# Patient Record
Sex: Female | Born: 1935 | Race: White | Hispanic: No | Marital: Married | Smoking: Never smoker
Health system: Southern US, Community
[De-identification: ages and names within clinical notes are randomized; demographics above are authoritative.]

## PROBLEM LIST (undated history)

## (undated) DIAGNOSIS — K219 Gastro-esophageal reflux disease without esophagitis: Secondary | ICD-10-CM

## (undated) DIAGNOSIS — I10 Essential (primary) hypertension: Secondary | ICD-10-CM

## (undated) DIAGNOSIS — F419 Anxiety disorder, unspecified: Secondary | ICD-10-CM

## (undated) DIAGNOSIS — N390 Urinary tract infection, site not specified: Secondary | ICD-10-CM

## (undated) DIAGNOSIS — E7800 Pure hypercholesterolemia, unspecified: Secondary | ICD-10-CM

## (undated) DIAGNOSIS — E871 Hypo-osmolality and hyponatremia: Secondary | ICD-10-CM

---

## 2014-07-14 ENCOUNTER — Emergency Department (HOSPITAL_BASED_OUTPATIENT_CLINIC_OR_DEPARTMENT_OTHER): Payer: Medicare Other

## 2014-07-14 ENCOUNTER — Emergency Department (HOSPITAL_BASED_OUTPATIENT_CLINIC_OR_DEPARTMENT_OTHER)
Admission: EM | Admit: 2014-07-14 | Discharge: 2014-07-14 | Disposition: A | Discharge status: Other Acute Inpatient Hospital | Payer: Medicare Other | Attending: Emergency Medicine | Admitting: Emergency Medicine

## 2014-07-14 ENCOUNTER — Encounter (HOSPITAL_BASED_OUTPATIENT_CLINIC_OR_DEPARTMENT_OTHER): Payer: Self-pay | Admitting: Emergency Medicine

## 2014-07-14 DIAGNOSIS — K219 Gastro-esophageal reflux disease without esophagitis: Secondary | ICD-10-CM | POA: Diagnosis not present

## 2014-07-14 DIAGNOSIS — Z79899 Other long term (current) drug therapy: Secondary | ICD-10-CM | POA: Diagnosis not present

## 2014-07-14 DIAGNOSIS — K921 Melena: Secondary | ICD-10-CM | POA: Diagnosis not present

## 2014-07-14 DIAGNOSIS — E871 Hypo-osmolality and hyponatremia: Secondary | ICD-10-CM | POA: Insufficient documentation

## 2014-07-14 DIAGNOSIS — K5900 Constipation, unspecified: Secondary | ICD-10-CM | POA: Diagnosis present

## 2014-07-14 DIAGNOSIS — I10 Essential (primary) hypertension: Secondary | ICD-10-CM | POA: Insufficient documentation

## 2014-07-14 DIAGNOSIS — E780 Pure hypercholesterolemia: Secondary | ICD-10-CM | POA: Diagnosis not present

## 2014-07-14 DIAGNOSIS — F419 Anxiety disorder, unspecified: Secondary | ICD-10-CM | POA: Diagnosis not present

## 2014-07-14 DIAGNOSIS — Z7982 Long term (current) use of aspirin: Secondary | ICD-10-CM | POA: Diagnosis not present

## 2014-07-14 DIAGNOSIS — N390 Urinary tract infection, site not specified: Secondary | ICD-10-CM | POA: Diagnosis not present

## 2014-07-14 HISTORY — DX: Anxiety disorder, unspecified: F41.9

## 2014-07-14 HISTORY — DX: Gastro-esophageal reflux disease without esophagitis: K21.9

## 2014-07-14 HISTORY — DX: Pure hypercholesterolemia, unspecified: E78.00

## 2014-07-14 HISTORY — DX: Essential (primary) hypertension: I10

## 2014-07-14 LAB — COMPREHENSIVE METABOLIC PANEL
ALBUMIN: 3.9 g/dL (ref 3.5–5.2)
ALK PHOS: 73 U/L (ref 39–117)
ALT: 17 U/L (ref 0–35)
AST: 23 U/L (ref 0–37)
Anion gap: 11 (ref 5–15)
BILIRUBIN TOTAL: 0.7 mg/dL (ref 0.3–1.2)
BUN: 16 mg/dL (ref 6–23)
CHLORIDE: 87 mmol/L — AB (ref 96–112)
CO2: 22 mmol/L (ref 19–32)
CREATININE: 0.84 mg/dL (ref 0.50–1.10)
Calcium: 8.8 mg/dL (ref 8.4–10.5)
GFR, EST AFRICAN AMERICAN: 71 mL/min — AB (ref >90)
GFR, EST NON AFRICAN AMERICAN: 62 mL/min — AB (ref >90)
GLUCOSE: 108 mg/dL — AB (ref 70–99)
POTASSIUM: 4.2 mmol/L (ref 3.5–5.1)
Sodium: 120 mmol/L — ABNORMAL LOW (ref 135–145)
Total Protein: 6.5 g/dL (ref 6.0–8.3)

## 2014-07-14 LAB — CBC WITH DIFFERENTIAL/PLATELET
BASOS ABS: 0 10*3/uL (ref 0.0–0.1)
BASOS PCT: 0 % (ref 0–1)
EOS ABS: 0 10*3/uL (ref 0.0–0.7)
Eosinophils Relative: 1 % (ref 0–5)
HCT: 33.9 % — ABNORMAL LOW (ref 36.0–46.0)
Hemoglobin: 12.3 g/dL (ref 12.0–15.0)
LYMPHS PCT: 25 % (ref 12–46)
Lymphs Abs: 2.1 10*3/uL (ref 0.7–4.0)
MCH: 30.3 pg (ref 26.0–34.0)
MCHC: 36.3 g/dL — ABNORMAL HIGH (ref 30.0–36.0)
MCV: 83.5 fL (ref 78.0–100.0)
MONO ABS: 1.1 10*3/uL — AB (ref 0.1–1.0)
Monocytes Relative: 13 % — ABNORMAL HIGH (ref 3–12)
Neutro Abs: 5.2 10*3/uL (ref 1.7–7.7)
Neutrophils Relative %: 61 % (ref 43–77)
PLATELETS: 273 10*3/uL (ref 150–400)
RBC: 4.06 MIL/uL (ref 3.87–5.11)
RDW: 11.2 % — AB (ref 11.5–15.5)
WBC: 8.4 10*3/uL (ref 4.0–10.5)

## 2014-07-14 LAB — URINALYSIS, ROUTINE W REFLEX MICROSCOPIC
Bilirubin Urine: NEGATIVE
GLUCOSE, UA: NEGATIVE mg/dL
Hgb urine dipstick: NEGATIVE
KETONES UR: 15 mg/dL — AB
Nitrite: NEGATIVE
PROTEIN: NEGATIVE mg/dL
Specific Gravity, Urine: 1.016 (ref 1.005–1.030)
Urobilinogen, UA: 0.2 mg/dL (ref 0.0–1.0)
pH: 5.5 (ref 5.0–8.0)

## 2014-07-14 LAB — URINE MICROSCOPIC-ADD ON

## 2014-07-14 MED ORDER — DEXTROSE 5 % IV SOLN
1.0000 g | Freq: Once | INTRAVENOUS | Status: AC
  Administered 2014-07-14: 1 g via INTRAVENOUS

## 2014-07-14 MED ORDER — SODIUM CHLORIDE 0.9 % IV BOLUS (SEPSIS)
1000.0000 mL | Freq: Once | INTRAVENOUS | Status: AC
  Administered 2014-07-14: 1000 mL via INTRAVENOUS

## 2014-07-14 MED ORDER — CEFTRIAXONE SODIUM 1 G IJ SOLR
INTRAMUSCULAR | Status: AC
  Filled 2014-07-14: qty 10

## 2014-07-14 NOTE — ED Provider Notes (Signed)
CSN: [id number]     Arrival date & time 07/14/14  1823 History  This chart was scribed for Rolan Bucco, MD by Freida Busman, ED Scribe. This patient was seen in room MH11/MH11 and the patient's care was started 7:05 PM.    Chief Complaint  Patient presents with  . Constipation  . Anxiety    The history is provided by the patient, the spouse and a relative. No language interpreter was used.   HPI Comments:  Sierra Giles is a 81 y.o. female who presents to the Emergency Department complaining of constipation which first started 3 weeks ago. Pt states she  took miralax which then caused diarrhea. She was then given a second medication to stop the diarrhea but is now constipated again. Her last BM was this am and was small. She states it was her first BM in several days. She denies pain of any kind, nausea, fever, CP, SOB, vomiting, dysuria,  change in normal urination, and cough.  Pt states she also doesn't feel right; states she feels nervous. Pt also notes she feels weaker than usual. No alleviating factors noted. Pt saw PCP ~ 1 week ago after noticing blood in her stool and had a rectal exam but no blood was seen. She did not have blood work done at the time and notes she has a follow up appointment in one week.  Past Medical History  Diagnosis Date  . Hypertension   . GERD (gastroesophageal reflux disease)   . Hypercholesterolemia   . Anxiety    No past surgical history on file. No family history on file. History  Substance Use Topics  . Smoking status: Never Smoker   . Smokeless tobacco: Not on file  . Alcohol Use: Not on file   OB History    No data available     Review of Systems  Constitutional: Positive for fatigue. Negative for fever.  Respiratory: Negative for cough and shortness of breath.   Cardiovascular: Negative for chest pain.  Gastrointestinal: Positive for constipation and blood in stool. Negative for nausea and vomiting.  Genitourinary: Negative for  dysuria and difficulty urinating.  Psychiatric/Behavioral: The patient is nervous/anxious.   All other systems reviewed and are negative.     Allergies  Ambien; Amlodipine; Hydrochlorothiazide; and Nitrofuran derivatives  Home Medications   Prior to Admission medications   Medication Sig Start Date End Date Taking? Authorizing Provider  ALPRAZolam Prudy Feeler) 0.25 MG tablet Take 0.25 mg by mouth at bedtime as needed for anxiety.   Yes Historical Provider, MD  aspirin 81 MG tablet Take 81 mg by mouth every other day.   Yes Historical Provider, MD  cloNIDine (CATAPRES) 0.3 MG tablet Take 0.3 mg by mouth 3 (three) times daily.   Yes Historical Provider, MD  hydrALAZINE (APRESOLINE) 50 MG tablet Take 75 mg by mouth 3 (three) times daily.   Yes Historical Provider, MD  latanoprost (XALATAN) 0.005 % ophthalmic solution Place 1 drop into both eyes at bedtime.   Yes Historical Provider, MD  nebivolol (BYSTOLIC) 2.5 MG tablet Take 2.5 mg by mouth 2 (two) times daily.   Yes Historical Provider, MD  omeprazole (PRILOSEC) 20 MG capsule Take 20 mg by mouth daily.   Yes Historical Provider, MD  simvastatin (ZOCOR) 5 MG tablet Take 5 mg by mouth daily.   Yes Historical Provider, MD  spironolactone (ALDACTONE) 25 MG tablet Take 25 mg by mouth daily.   Yes Historical Provider, MD  valsartan (DIOVAN) 320 MG tablet Take  320 mg by mouth daily.   Yes Historical Provider, MD   BP 163/62 mmHg  Pulse 69  Temp(Src) 98.6 F (37 C) (Oral)  Resp 15  Ht 5\' 3"  (1.6 m)  Wt 132 lb (59.875 kg)  BMI 23.39 kg/m2  SpO2 94% Physical Exam  Constitutional: She is oriented to person, place, and time. She appears well-developed and well-nourished.  HENT:  Head: Normocephalic and atraumatic.  Eyes: Pupils are equal, round, and reactive to light.  Neck: Normal range of motion. Neck supple.  Cardiovascular: Normal rate, regular rhythm and normal heart sounds.   Pulmonary/Chest: Effort normal and breath sounds normal. No  respiratory distress. She has no wheezes. She has no rales. She exhibits no tenderness.  Abdominal: Soft. Bowel sounds are normal. There is no tenderness. There is no rebound and no guarding.  Musculoskeletal: Normal range of motion. She exhibits no edema.  Lymphadenopathy:    She has no cervical adenopathy.  Neurological: She is alert and oriented to person, place, and time.  Skin: Skin is warm and dry. No rash noted.  Psychiatric: She has a normal mood and affect.    ED Course  Procedures   DIAGNOSTIC STUDIES:  Oxygen Saturation is 96% on RA, normal by my interpretation.    COORDINATION OF CARE:  7:13 PM Will order lab work, UA and an XR. Discussed treatment plan with pt and family at bedside and pt agreed to plan.  Labs Review Labs Reviewed  COMPREHENSIVE METABOLIC PANEL - Abnormal; Notable for the following:    Sodium 120 (*)    Chloride 87 (*)    Glucose, Bld 108 (*)    GFR calc non Af Amer 62 (*)    GFR calc Af Amer 71 (*)    All other components within normal limits  CBC WITH DIFFERENTIAL/PLATELET - Abnormal; Notable for the following:    HCT 33.9 (*)    MCHC 36.3 (*)    RDW 11.2 (*)    Monocytes Relative 13 (*)    Monocytes Absolute 1.1 (*)    All other components within normal limits  URINALYSIS, ROUTINE W REFLEX MICROSCOPIC - Abnormal; Notable for the following:    APPearance CLOUDY (*)    Ketones, ur 15 (*)    Leukocytes, UA LARGE (*)    All other components within normal limits  URINE MICROSCOPIC-ADD ON - Abnormal; Notable for the following:    Squamous Epithelial / LPF FEW (*)    Bacteria, UA MANY (*)    All other components within normal limits  URINE CULTURE    Imaging Review Dg Abd Acute W/chest  07/14/2014   CLINICAL DATA:  Weakness,Constipation, and Diarrhea x 2 weeks.  EXAM: ACUTE ABDOMEN SERIES (ABDOMEN 2 VIEW & CHEST 1 VIEW)  COMPARISON:  None.  FINDINGS: There is no evidence of dilated bowel loops or free intraperitoneal air. No radiopaque  calculi or other significant radiographic abnormality is seen. Heart size and mediastinal contours are within normal limits. Both lungs are clear. Hyperexpansion of the chest is present compatible with emphysema. Bilateral pleural apical scarring.  IMPRESSION: Negative abdominal radiographs.  No acute cardiopulmonary disease.   Electronically Signed   By: Andreas NewportGeoffrey  Lamke M.D.   On: 07/14/2014 19:35     EKG Interpretation   Date/Time:  Wednesday July 14 2014 21:19:29 EDT Ventricular Rate:  70 PR Interval:  292 QRS Duration: 78 QT Interval:  384 QTC Calculation: 414 R Axis:   58 Text Interpretation:  Sinus rhythm with 1st  degree A-V block Otherwise  normal ECG No old tracing to compare Confirmed by Horacio Werth  MD, Minas Bonser  [phone]) on 07/14/2014 11:55:15 PM      MDM   Final diagnoses:  Hyponatremia  UTI (lower urinary tract infection)    Patient has evidence of marked hyponatremia. She also has a UTI. She was given IV fluids. She is also given dose of Rocephin. Her urine was sent for culture. I feel that she should be admitted for IV hydration and monitoring until her hyponatremia starts improving. I consult the hospitalist, Dr. Lowell Guitar at [facility] who is accepted the patient for transfer. I also updated the patient's son, Dr. Elder Love, from [place], [place].  I personally performed the services described in this documentation, which was scribed in my presence.  The recorded information has been reviewed and considered.    Rolan Bucco, MD 07/14/14 [phone]

## 2014-07-14 NOTE — ED Notes (Signed)
Pt having constipation for several weeks.  Pt seen by PCP and was given miralax. And then was given something for diarrhea.  Pt also admits she feels "nervous" or anxiety.

## 2014-07-16 LAB — URINE CULTURE: Colony Count: >=100000

## 2014-07-18 ENCOUNTER — Telehealth: Payer: Self-pay | Admitting: Emergency Medicine

## 2014-07-18 NOTE — Telephone Encounter (Signed)
[facility], [facility] [phone].

## 2014-11-28 ENCOUNTER — Emergency Department (HOSPITAL_BASED_OUTPATIENT_CLINIC_OR_DEPARTMENT_OTHER)
Admission: EM | Admit: 2014-11-28 | Discharge: 2014-11-28 | Disposition: A | Discharge status: Home / Self Care | Payer: Medicare Other | Attending: Emergency Medicine | Admitting: Emergency Medicine

## 2014-11-28 ENCOUNTER — Encounter (HOSPITAL_BASED_OUTPATIENT_CLINIC_OR_DEPARTMENT_OTHER): Payer: Self-pay

## 2014-11-28 ENCOUNTER — Emergency Department (HOSPITAL_BASED_OUTPATIENT_CLINIC_OR_DEPARTMENT_OTHER): Payer: Medicare Other

## 2014-11-28 DIAGNOSIS — F419 Anxiety disorder, unspecified: Secondary | ICD-10-CM | POA: Diagnosis not present

## 2014-11-28 DIAGNOSIS — S79911A Unspecified injury of right hip, initial encounter: Secondary | ICD-10-CM | POA: Insufficient documentation

## 2014-11-28 DIAGNOSIS — Y9289 Other specified places as the place of occurrence of the external cause: Secondary | ICD-10-CM | POA: Insufficient documentation

## 2014-11-28 DIAGNOSIS — S0003XA Contusion of scalp, initial encounter: Secondary | ICD-10-CM | POA: Diagnosis not present

## 2014-11-28 DIAGNOSIS — S299XXA Unspecified injury of thorax, initial encounter: Secondary | ICD-10-CM | POA: Diagnosis present

## 2014-11-28 DIAGNOSIS — I10 Essential (primary) hypertension: Secondary | ICD-10-CM | POA: Insufficient documentation

## 2014-11-28 DIAGNOSIS — Z79899 Other long term (current) drug therapy: Secondary | ICD-10-CM | POA: Insufficient documentation

## 2014-11-28 DIAGNOSIS — E780 Pure hypercholesterolemia: Secondary | ICD-10-CM | POA: Diagnosis not present

## 2014-11-28 DIAGNOSIS — Z7982 Long term (current) use of aspirin: Secondary | ICD-10-CM | POA: Insufficient documentation

## 2014-11-28 DIAGNOSIS — Z7951 Long term (current) use of inhaled steroids: Secondary | ICD-10-CM | POA: Diagnosis not present

## 2014-11-28 DIAGNOSIS — Z792 Long term (current) use of antibiotics: Secondary | ICD-10-CM | POA: Diagnosis not present

## 2014-11-28 DIAGNOSIS — S20211A Contusion of right front wall of thorax, initial encounter: Secondary | ICD-10-CM | POA: Diagnosis not present

## 2014-11-28 DIAGNOSIS — Y998 Other external cause status: Secondary | ICD-10-CM | POA: Insufficient documentation

## 2014-11-28 DIAGNOSIS — K219 Gastro-esophageal reflux disease without esophagitis: Secondary | ICD-10-CM | POA: Insufficient documentation

## 2014-11-28 DIAGNOSIS — W19XXXA Unspecified fall, initial encounter: Secondary | ICD-10-CM

## 2014-11-28 DIAGNOSIS — Z87440 Personal history of urinary (tract) infections: Secondary | ICD-10-CM | POA: Diagnosis not present

## 2014-11-28 DIAGNOSIS — Y9389 Activity, other specified: Secondary | ICD-10-CM | POA: Insufficient documentation

## 2014-11-28 DIAGNOSIS — S300XXA Contusion of lower back and pelvis, initial encounter: Secondary | ICD-10-CM | POA: Insufficient documentation

## 2014-11-28 DIAGNOSIS — W07XXXA Fall from chair, initial encounter: Secondary | ICD-10-CM | POA: Diagnosis not present

## 2014-11-28 HISTORY — DX: Urinary tract infection, site not specified: N39.0

## 2014-11-28 HISTORY — DX: Hypo-osmolality and hyponatremia: E87.1

## 2014-11-28 NOTE — Discharge Instructions (Signed)

## 2014-11-28 NOTE — ED Provider Notes (Signed)
CSN: [id number]     Arrival date & time 11/28/14  1631 History  This chart was scribed for Benjiman Core, MD by Budd Palmer, ED Scribe. This patient was seen in room MH11/MH11 and the patient's care was started at 5:11 PM.    Chief Complaint  Patient presents with  . Fall   The history is provided by the patient and a relative. No language interpreter was used.   HPI Comments: Sierra Giles is a 81 y.o. female with a PMHx of HTN and anxiety who presents to the Emergency Department complaining of a fall which occurred 1 day ago. She was sitting in her rocking chair when she got up and her right leg felt numb and gave out on her, causing her to fall. She reports hitting her head. She has since been able to ambulate and states that the leg feels normal now. She notes associated pain in the back of the head, right-sided chest pain exacerbated by movement, as well as ecchymosis and pain to the right hip. She takes baby aspirin every other day. She is not on any blood thinners. She has been treated for a recent UTI and hyponatremia, for which she was admitted to the hospital for 8 days in March of 2016. Per her relatives, she is scheduled for an MRI in a few days to check her pancreas. She has been on antibiotics for the past 7 days. She has had blood work done by a GI specialist and her PCP last week.  Past Medical History  Diagnosis Date  . Hypertension   . GERD (gastroesophageal reflux disease)   . Hypercholesterolemia   . Anxiety   . Hyponatremia   . UTI (lower urinary tract infection)    No past surgical history on file. No family history on file. Social History  Substance Use Topics  . Smoking status: Never Smoker   . Smokeless tobacco: None  . Alcohol Use: None   OB History    No data available     Review of Systems  Cardiovascular: Positive for chest pain.  Musculoskeletal: Positive for myalgias.  Skin: Positive for color change.  All other systems reviewed and are  negative.   Allergies  Ambien; Amlodipine; Hydrochlorothiazide; and Nitrofuran derivatives  Home Medications   Prior to Admission medications   Medication Sig Start Date End Date Taking? Authorizing Provider  ciprofloxacin (CIPRO) 250 MG tablet Take 250 mg by mouth 2 (two) times daily.   Yes Historical Provider, MD  escitalopram (LEXAPRO) 5 MG tablet Take 5 mg by mouth daily.   Yes Historical Provider, MD  fluticasone (FLONASE) 50 MCG/ACT nasal spray Place into both nostrils daily.   Yes Historical Provider, MD  Loratadine (CLARITIN PO) Take by mouth.   Yes Historical Provider, MD  traZODone (DESYREL) 50 MG tablet Take 50 mg by mouth at bedtime.   Yes Historical Provider, MD  ALPRAZolam Prudy Feeler) 0.25 MG tablet Take 0.5 mg by mouth 2 (two) times daily as needed for anxiety.     Historical Provider, MD  aspirin 81 MG tablet Take 81 mg by mouth every other day.    Historical Provider, MD  cloNIDine (CATAPRES) 0.3 MG tablet Take 0.3 mg by mouth 3 (three) times daily.    Historical Provider, MD  hydrALAZINE (APRESOLINE) 50 MG tablet Take 50 mg by mouth 3 (three) times daily.     Historical Provider, MD  latanoprost (XALATAN) 0.005 % ophthalmic solution Place 1 drop into both eyes at bedtime.  Historical Provider, MD  nebivolol (BYSTOLIC) 2.5 MG tablet Take 2.5 mg by mouth 2 (two) times daily.    Historical Provider, MD  omeprazole (PRILOSEC) 20 MG capsule Take 20 mg by mouth daily.    Historical Provider, MD  simvastatin (ZOCOR) 5 MG tablet Take 5 mg by mouth daily.    Historical Provider, MD  spironolactone (ALDACTONE) 25 MG tablet Take 25 mg by mouth daily.    Historical Provider, MD  valsartan (DIOVAN) 320 MG tablet Take 320 mg by mouth daily.    Historical Provider, MD   BP 161/59 mmHg  Pulse 75  Temp(Src) 98.1 F (36.7 C) (Oral)  Resp 18  Ht 5' 3.5" (1.613 m)  Wt 118 lb (53.524 kg)  BMI 20.57 kg/m2  SpO2 94% Physical Exam  Constitutional: She is oriented to person, place, and  time. She appears well-developed and well-nourished. No distress.  HENT:  Head: Normocephalic and atraumatic.  Mouth/Throat: Oropharynx is clear and moist.  Small hematoma on the right occipital area of the scalp with no ttp and no step-off.  Face is atraumatic  Eyes: Conjunctivae and EOM are normal. Pupils are equal, round, and reactive to light.  Neck: Normal range of motion. Neck supple. No tracheal deviation present.  Cardiovascular: Normal rate, regular rhythm and normal heart sounds.   Pulmonary/Chest: Effort normal and breath sounds normal. No respiratory distress. She exhibits tenderness.  Moderate ttp on right chest  Abdominal: Soft. Bowel sounds are normal. She exhibits no mass. There is no tenderness.  Musculoskeletal: Normal range of motion.  No c-spine ttp. No CVA tenderness. Good ROM of R hip with no ttp. Pelvis is stable to compression.  Neurological: She is alert and oriented to person, place, and time.  Skin: Skin is warm and dry.  Ecchymosis in R buttock area.  Psychiatric: She has a normal mood and affect. Her behavior is normal.  Nursing note and vitals reviewed.   ED Course  Procedures  DIAGNOSTIC STUDIES: Oxygen Saturation is 100% on RA, normal by my interpretation.    COORDINATION OF CARE: 5:19 PM - Discussed plans to order diagnostic imaging. Pt advised of plan for treatment and pt agrees.  Labs Review Labs Reviewed - No data to display  Imaging Review No results found.   EKG Interpretation   Date/Time:  Sunday November 28 2014 17:00:32 EDT Ventricular Rate:  86 PR Interval:  298 QRS Duration: 68 QT Interval:  370 QTC Calculation: 442 R Axis:   36 Text Interpretation:  Sinus rhythm with 1st degree A-V block Otherwise  normal ECG ED PHYSICIAN INTERPRETATION AVAILABLE IN [facility]  Confirmed by TEST, Record ([id number]) on 11/29/2014 6:40:18 AM      MDM   Final diagnoses:  Fall, initial encounter  Chest wall contusion, right, initial encounter   Contusion, buttock, initial encounter    Patient with fall. Negative imaging. Discussed possibility of occult rib fracture.  Will discharge home.   I personally performed the services described in this documentation, which was scribed in my presence. The recorded information has been reviewed and is accurate.    Benjiman Core, MD 12/01/14 [phone]

## 2014-11-28 NOTE — ED Notes (Addendum)
Patient states that she fell last night because her leg would not support her, c/o R hip pain, hit R side of head, sternum pain & bruise to L side of buttock, hurts to walk & take a breath. Currently on antibiotic for UTI

## 2015-08-16 ENCOUNTER — Emergency Department (HOSPITAL_BASED_OUTPATIENT_CLINIC_OR_DEPARTMENT_OTHER): Payer: Medicare Other

## 2015-08-16 ENCOUNTER — Emergency Department (HOSPITAL_BASED_OUTPATIENT_CLINIC_OR_DEPARTMENT_OTHER)
Admission: EM | Admit: 2015-08-16 | Discharge: 2015-08-16 | Disposition: A | Discharge status: Home / Self Care | Payer: Medicare Other | Attending: Emergency Medicine | Admitting: Emergency Medicine

## 2015-08-16 ENCOUNTER — Encounter (HOSPITAL_BASED_OUTPATIENT_CLINIC_OR_DEPARTMENT_OTHER): Payer: Self-pay | Admitting: *Deleted

## 2015-08-16 DIAGNOSIS — Z87440 Personal history of urinary (tract) infections: Secondary | ICD-10-CM | POA: Insufficient documentation

## 2015-08-16 DIAGNOSIS — Z8639 Personal history of other endocrine, nutritional and metabolic disease: Secondary | ICD-10-CM | POA: Insufficient documentation

## 2015-08-16 DIAGNOSIS — Z792 Long term (current) use of antibiotics: Secondary | ICD-10-CM | POA: Insufficient documentation

## 2015-08-16 DIAGNOSIS — F419 Anxiety disorder, unspecified: Secondary | ICD-10-CM | POA: Diagnosis not present

## 2015-08-16 DIAGNOSIS — J029 Acute pharyngitis, unspecified: Secondary | ICD-10-CM | POA: Insufficient documentation

## 2015-08-16 DIAGNOSIS — M1990 Unspecified osteoarthritis, unspecified site: Secondary | ICD-10-CM | POA: Insufficient documentation

## 2015-08-16 DIAGNOSIS — Z7982 Long term (current) use of aspirin: Secondary | ICD-10-CM | POA: Insufficient documentation

## 2015-08-16 DIAGNOSIS — K219 Gastro-esophageal reflux disease without esophagitis: Secondary | ICD-10-CM | POA: Diagnosis not present

## 2015-08-16 DIAGNOSIS — I10 Essential (primary) hypertension: Secondary | ICD-10-CM | POA: Insufficient documentation

## 2015-08-16 DIAGNOSIS — R079 Chest pain, unspecified: Secondary | ICD-10-CM | POA: Diagnosis present

## 2015-08-16 DIAGNOSIS — Z79899 Other long term (current) drug therapy: Secondary | ICD-10-CM | POA: Diagnosis not present

## 2015-08-16 DIAGNOSIS — R091 Pleurisy: Secondary | ICD-10-CM | POA: Diagnosis not present

## 2015-08-16 LAB — CBC WITH DIFFERENTIAL/PLATELET
BASOS ABS: 0 10*3/uL (ref 0.0–0.1)
BASOS PCT: 0 %
EOS PCT: 2 %
Eosinophils Absolute: 0.2 10*3/uL (ref 0.0–0.7)
HCT: 33.4 % — ABNORMAL LOW (ref 36.0–46.0)
Hemoglobin: 11.5 g/dL — ABNORMAL LOW (ref 12.0–15.0)
Lymphocytes Relative: 11 %
Lymphs Abs: 1 10*3/uL (ref 0.7–4.0)
MCH: 30.7 pg (ref 26.0–34.0)
MCHC: 34.4 g/dL (ref 30.0–36.0)
MCV: 89.1 fL (ref 78.0–100.0)
MONO ABS: 0.5 10*3/uL (ref 0.1–1.0)
Monocytes Relative: 6 %
NEUTROS ABS: 7.7 10*3/uL (ref 1.7–7.7)
Neutrophils Relative %: 81 %
PLATELETS: 262 10*3/uL (ref 150–400)
RBC: 3.75 MIL/uL — ABNORMAL LOW (ref 3.87–5.11)
RDW: 12.3 % (ref 11.5–15.5)
WBC: 9.4 10*3/uL (ref 4.0–10.5)

## 2015-08-16 LAB — URINALYSIS, ROUTINE W REFLEX MICROSCOPIC
BILIRUBIN URINE: NEGATIVE
Glucose, UA: NEGATIVE mg/dL
Hgb urine dipstick: NEGATIVE
KETONES UR: NEGATIVE mg/dL
NITRITE: NEGATIVE
Protein, ur: NEGATIVE mg/dL
Specific Gravity, Urine: 1.011 (ref 1.005–1.030)
pH: 6.5 (ref 5.0–8.0)

## 2015-08-16 LAB — BASIC METABOLIC PANEL
ANION GAP: 11 (ref 5–15)
BUN: 12 mg/dL (ref 6–20)
CALCIUM: 8.2 mg/dL — AB (ref 8.9–10.3)
CO2: 21 mmol/L — ABNORMAL LOW (ref 22–32)
Chloride: 96 mmol/L — ABNORMAL LOW (ref 101–111)
Creatinine, Ser: 0.65 mg/dL (ref 0.44–1.00)
Glucose, Bld: 99 mg/dL (ref 65–99)
Potassium: 4.3 mmol/L (ref 3.5–5.1)
Sodium: 128 mmol/L — ABNORMAL LOW (ref 135–145)

## 2015-08-16 LAB — URINE MICROSCOPIC-ADD ON

## 2015-08-16 LAB — RAPID STREP SCREEN (MED CTR MEBANE ONLY): STREPTOCOCCUS, GROUP A SCREEN (DIRECT): NEGATIVE

## 2015-08-16 LAB — TROPONIN I: Troponin I: <0.03 ng/mL (ref <0.031)

## 2015-08-16 MED ORDER — NAPROXEN 375 MG PO TABS: 375.0000 mg | ORAL_TABLET | Freq: Two times a day (BID) | ORAL | Status: DC

## 2015-08-16 MED ORDER — HYDROCODONE-ACETAMINOPHEN 7.5-325 MG/15ML PO SOLN: ORAL | Status: DC

## 2015-08-16 MED ORDER — ONDANSETRON HCL 4 MG/2ML IJ SOLN
4.0000 mg | Freq: Once | INTRAMUSCULAR | Status: AC
  Administered 2015-08-16: 4 mg via INTRAVENOUS
  Filled 2015-08-16: qty 2

## 2015-08-16 MED ORDER — HYDROCODONE-ACETAMINOPHEN 5-325 MG PO TABS
1.0000 | ORAL_TABLET | Freq: Once | ORAL | Status: AC
  Administered 2015-08-16: 1 via ORAL
  Filled 2015-08-16: qty 1

## 2015-08-16 NOTE — ED Notes (Signed)
C/o sorethroat and pain under left breast, bil hand pain  And left arm pain. No n/v, slight shortness  Of breath. Onset Thursday.

## 2015-08-16 NOTE — Discharge Instructions (Signed)
Please recheck with rash or blisters on the chest, fevers, worsening pain, or other symptoms. One half, to 1 teaspoon of Rx pain medicine every 4-6 hours as needed for pain.   Pleurisy Pleurisy is an inflammation and swelling of the lining of the lungs (pleura). Because of this inflammation, it hurts to breathe. It can be aggravated by coughing, laughing, or deep breathing. Pleurisy is often caused by an underlying infection or disease.  HOME CARE INSTRUCTIONS  Monitor your pleurisy for any changes. The following actions may help to alleviate any discomfort you are experiencing:  Medicine may help with pain. Only take over-the-counter or prescription medicines for pain, discomfort, or fever as directed by your health care provider.  Only take antibiotic medicine as directed. Make sure to finish it even if you start to feel better. SEEK MEDICAL CARE IF:   Your pain is not controlled with medicine or is increasing.  You have an increase in pus-like (purulent) secretions brought up with coughing. SEEK IMMEDIATE MEDICAL CARE IF:   You have blue or dark lips, fingernails, or toenails.  You are coughing up blood.  You have increased difficulty breathing.  You have continuing pain unrelieved by medicine or pain lasting more than 1 week.  You have pain that radiates into your neck, arms, or jaw.  You develop increased shortness of breath or wheezing.  You develop a fever, rash, vomiting, fainting, or other serious symptoms. MAKE SURE YOU:  Understand these instructions.   Will watch your condition.   Will get help right away if you are not doing well or get worse.    This information is not intended to replace advice given to you by your health care provider. Make sure you discuss any questions you have with your health care provider.   Document Released: 04/09/2005 Document Revised: 12/10/2012 Document Reviewed: 09/21/2012 Elsevier Interactive Patient Education [employer]2016 Elsevier  Inc.

## 2015-08-16 NOTE — ED Provider Notes (Addendum)
CSN: [id number]     Arrival date & time 08/16/15  1537 History   First MD Initiated Contact with Patient 08/16/15 1545     Chief Complaint  Patient presents with  . Chest Pain     HPI  She presents for evaluation of left-sided chest pain. Symptoms for the last 4 days. Her main complaint to me upon arrival to sore throat for 4 days and then mentions the left breast. Triage note states some difficulty using her hands. 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 No nausea vomiting. No fevers. Shortness of breath only related to pain with deep breathing. No shift cardiac disease. She is a nonsmoker.  Past Medical History  Diagnosis Date  . Hypertension   . GERD (gastroesophageal reflux disease)   . Hypercholesterolemia   . Anxiety   . Hyponatremia   . UTI (lower urinary tract infection)    History reviewed. No pertinent past surgical history. No family history on file. Social History  Substance Use Topics  . Smoking status: Never Smoker   . Smokeless tobacco: None  . Alcohol Use: None   OB History    No data available     Review of Systems  Constitutional: Negative for fever, chills, diaphoresis, appetite change and fatigue.  HENT: Positive for sore throat. Negative for mouth sores and trouble swallowing.   Eyes: Negative for visual disturbance.  Respiratory: Negative for cough, chest tightness, shortness of breath and wheezing.   Cardiovascular: Positive for chest pain.  Gastrointestinal: Negative for nausea, vomiting, abdominal pain, diarrhea and abdominal distention.  Endocrine: Negative for polydipsia, polyphagia and polyuria.  Genitourinary: Negative for dysuria, frequency and hematuria.  Musculoskeletal: Negative for gait problem.  Skin: Negative for color change, pallor and rash.  Neurological: Negative for dizziness, syncope, light-headedness and headaches.  Hematological: Does not bruise/bleed  easily.  Psychiatric/Behavioral: Negative for behavioral problems and confusion.      Allergies  Ambien; Amlodipine; Hydrochlorothiazide; and Nitrofuran derivatives  Home Medications   Prior to Admission medications   Medication Sig Start Date End Date Taking? Authorizing Provider  ALPRAZolam (XANAX) 0.25 MG tablet Take 0.5 mg by mouth 2 (two) times daily as needed for anxiety.    Yes Historical Provider, MD  aspirin 81 MG tablet Take 81 mg by mouth every other day.   Yes Historical Provider, MD  ciprofloxacin (CIPRO) 250 MG tablet Take 250 mg by mouth 2 (two) times daily.   Yes Historical Provider, MD  cloNIDine (CATAPRES) 0.3 MG tablet Take 0.3 mg by mouth 3 (three) times daily.   Yes Historical Provider, MD  escitalopram (LEXAPRO) 5 MG tablet Take 5 mg by mouth daily.   Yes Historical Provider, MD  hydrALAZINE (APRESOLINE) 50 MG tablet Take 50 mg by mouth 3 (three) times daily.    Yes Historical Provider, MD  nebivolol (BYSTOLIC) 2.5 MG tablet Take 2.5 mg by mouth 2 (two) times daily.   Yes Historical Provider, MD  omeprazole (PRILOSEC) 20 MG capsule Take 20 mg by mouth daily.   Yes Historical Provider, MD  traZODone (DESYREL) 50 MG tablet Take 50 mg by mouth at bedtime.   Yes Historical Provider, MD  HYDROcodone-acetaminophen (HYCET) 7.5-325 mg/15 ml solution 2.5-5 ml po q 4 hrs prn pain 08/16/15   Rolland Porter, MD  naproxen (NAPROSYN) 375 MG tablet Take 1 tablet (375 mg total) by mouth 2 (two) times daily. 08/16/15   Rolland Porter,  MD   BP 145/58 mmHg  Pulse 80  Temp(Src) 98.1 F (36.7 C) (Oral)  Resp 16  Ht  (1.626 m)  Wt 118 lb (53.524 kg)  BMI 20.24 kg/m2  SpO2 96% Physical Exam  Constitutional: She is oriented to person, place, and time. She appears well-developed and well-nourished. No distress.  HENT:  Head: Normocephalic.  Posterior differential erythema but no exudate. No adenopathy in the neck. Supple neck.  Eyes: Conjunctivae are normal. Pupils are equal, round,  and reactive to light. No scleral icterus.  Neck: Normal range of motion. Neck supple. No thyromegaly present.  Cardiovascular: Normal rate and regular rhythm.  Exam reveals no gallop and no friction rub.   No murmur heard. Pulmonary/Chest: Effort normal and breath sounds normal. No respiratory distress. She has no wheezes. She has no rales.  Lungs clear. No wheezing rales rhonchi. Nontender over the chest wall skin. No vesicles or signs of zoster clinically.  Abdominal: Soft. Bowel sounds are normal. She exhibits no distension. There is no tenderness. There is no rebound.  Musculoskeletal: Normal range of motion.  Neurological: She is alert and oriented to person, place, and time.  Skin: Skin is warm and dry. No rash noted.  Psychiatric: She has a normal mood and affect. Her behavior is normal.    ED Course  Procedures (including critical care time) Labs Review Labs Reviewed  CBC WITH DIFFERENTIAL/PLATELET - Abnormal; Notable for the following:    RBC 3.75 (*)    Hemoglobin 11.5 (*)    HCT 33.4 (*)    All other components within normal limits  BASIC METABOLIC PANEL - Abnormal; Notable for the following:    Sodium 128 (*)    Chloride 96 (*)    CO2 21 (*)    Calcium 8.2 (*)    All other components within normal limits  URINALYSIS, ROUTINE W REFLEX MICROSCOPIC (NOT AT [facility]) - Abnormal; Notable for the following:    Leukocytes, UA SMALL (*)    All other components within normal limits  URINE MICROSCOPIC-ADD ON - Abnormal; Notable for the following:    Squamous Epithelial / LPF 0-5 (*)    Bacteria, UA RARE (*)    All other components within normal limits  RAPID STREP SCREEN (NOT AT [facility])  CULTURE, GROUP A STREP [facility])  TROPONIN I  TROPONIN I    Imaging Review Dg Chest 2 View  08/16/2015  CLINICAL DATA:  Left-sided chest pain for few days with shortness of Breath EXAM: CHEST  2 VIEW COMPARISON:  11/28/2014 FINDINGS: Cardiac shadow is stable. The lungs are well aerated  bilaterally. No focal infiltrate or sizable effusion is seen. No acute bony abnormality is noted. IMPRESSION: No active cardiopulmonary disease. Electronically Signed   By: Alcide Clever M.D.   On: 08/16/2015 16:55   I have personally reviewed and evaluated these images and lab results as part of my medical decision-making.   EKG Interpretation   Date/Time:  Tuesday August 16 2015 16:05:28 EDT Ventricular Rate:  81 PR Interval:    QRS Duration: 87 QT Interval:  385 QTC Calculation: 447 R Axis:   46 Text Interpretation:  Sinus rhythm  with artifact Probable anterior  infarct, old Artifact in lead(s) I III aVR aVL Reconfirmed by Fayrene Fearing  MD,  Joangel Vanosdol ([id number]) on 08/16/2015 7:51:16 PM      MDM   Final diagnoses:  Pleurisy   Negative workup here. 2 troponins normal 2. EKG without acute changes. No abnormal is on exam,  her chest x-ray. Negative strep. Likely pleurisy. His had viral symptoms and her husband was ill several days ago. Her son is a cardiologist and spoken with her on Sunday about her symptoms and was not concerned. I did speak with him on the phone and relayed her findings. Plan will be Lortab, naproxen, primary care follow-up. Return precautions including rash, worsening pain, shortness of breath, fever, or other new or worsening.     Rolland PorterMark Kyaira Trantham, MD 08/16/15 1950  Rolland PorterMark Shahir Karen, MD 08/16/15 [phone]

## 2015-08-19 LAB — CULTURE, GROUP A STREP (THRC)

## 2017-05-30 ENCOUNTER — Emergency Department (HOSPITAL_BASED_OUTPATIENT_CLINIC_OR_DEPARTMENT_OTHER): Payer: Medicare Other

## 2017-05-30 ENCOUNTER — Other Ambulatory Visit: Payer: Self-pay

## 2017-05-30 ENCOUNTER — Encounter (HOSPITAL_BASED_OUTPATIENT_CLINIC_OR_DEPARTMENT_OTHER): Payer: Self-pay | Admitting: Emergency Medicine

## 2017-05-30 ENCOUNTER — Inpatient Hospital Stay (HOSPITAL_BASED_OUTPATIENT_CLINIC_OR_DEPARTMENT_OTHER)
Admission: EM | Admit: 2017-05-30 | Discharge: 2017-06-10 | DRG: 871 | Disposition: A | Discharge status: Skilled Nursing Facility | Payer: Medicare Other | Attending: Internal Medicine | Admitting: Internal Medicine

## 2017-05-30 DIAGNOSIS — K869 Disease of pancreas, unspecified: Secondary | ICD-10-CM | POA: Diagnosis present

## 2017-05-30 DIAGNOSIS — Y848 Other medical procedures as the cause of abnormal reaction of the patient, or of later complication, without mention of misadventure at the time of the procedure: Secondary | ICD-10-CM | POA: Diagnosis not present

## 2017-05-30 DIAGNOSIS — K219 Gastro-esophageal reflux disease without esophagitis: Secondary | ICD-10-CM | POA: Diagnosis present

## 2017-05-30 DIAGNOSIS — Z79899 Other long term (current) drug therapy: Secondary | ICD-10-CM

## 2017-05-30 DIAGNOSIS — J189 Pneumonia, unspecified organism: Secondary | ICD-10-CM

## 2017-05-30 DIAGNOSIS — Z888 Allergy status to other drugs, medicaments and biological substances status: Secondary | ICD-10-CM

## 2017-05-30 DIAGNOSIS — R0600 Dyspnea, unspecified: Secondary | ICD-10-CM

## 2017-05-30 DIAGNOSIS — Y731 Therapeutic (nonsurgical) and rehabilitative gastroenterology and urology devices associated with adverse incidents: Secondary | ICD-10-CM | POA: Diagnosis not present

## 2017-05-30 DIAGNOSIS — E43 Unspecified severe protein-calorie malnutrition: Secondary | ICD-10-CM | POA: Diagnosis present

## 2017-05-30 DIAGNOSIS — R944 Abnormal results of kidney function studies: Secondary | ICD-10-CM | POA: Diagnosis present

## 2017-05-30 DIAGNOSIS — D649 Anemia, unspecified: Secondary | ICD-10-CM | POA: Diagnosis present

## 2017-05-30 DIAGNOSIS — E876 Hypokalemia: Secondary | ICD-10-CM | POA: Diagnosis present

## 2017-05-30 DIAGNOSIS — Z681 Body mass index (BMI) 19 or less, adult: Secondary | ICD-10-CM | POA: Diagnosis not present

## 2017-05-30 DIAGNOSIS — I10 Essential (primary) hypertension: Secondary | ICD-10-CM | POA: Diagnosis present

## 2017-05-30 DIAGNOSIS — Z881 Allergy status to other antibiotic agents status: Secondary | ICD-10-CM

## 2017-05-30 DIAGNOSIS — F419 Anxiety disorder, unspecified: Secondary | ICD-10-CM | POA: Diagnosis not present

## 2017-05-30 DIAGNOSIS — Z5329 Procedure and treatment not carried out because of patient's decision for other reasons: Secondary | ICD-10-CM | POA: Diagnosis not present

## 2017-05-30 DIAGNOSIS — T3695XA Adverse effect of unspecified systemic antibiotic, initial encounter: Secondary | ICD-10-CM | POA: Diagnosis not present

## 2017-05-30 DIAGNOSIS — J690 Pneumonitis due to inhalation of food and vomit: Secondary | ICD-10-CM | POA: Diagnosis present

## 2017-05-30 DIAGNOSIS — R197 Diarrhea, unspecified: Secondary | ICD-10-CM | POA: Diagnosis not present

## 2017-05-30 DIAGNOSIS — Z7982 Long term (current) use of aspirin: Secondary | ICD-10-CM

## 2017-05-30 DIAGNOSIS — A419 Sepsis, unspecified organism: Secondary | ICD-10-CM | POA: Diagnosis present

## 2017-05-30 DIAGNOSIS — M549 Dorsalgia, unspecified: Secondary | ICD-10-CM | POA: Diagnosis present

## 2017-05-30 DIAGNOSIS — J9601 Acute respiratory failure with hypoxia: Secondary | ICD-10-CM | POA: Diagnosis present

## 2017-05-30 DIAGNOSIS — B370 Candidal stomatitis: Secondary | ICD-10-CM | POA: Diagnosis present

## 2017-05-30 DIAGNOSIS — F411 Generalized anxiety disorder: Secondary | ICD-10-CM | POA: Diagnosis present

## 2017-05-30 DIAGNOSIS — R402414 Glasgow coma scale score 13-15, 24 hours or more after hospital admission: Secondary | ICD-10-CM | POA: Diagnosis not present

## 2017-05-30 DIAGNOSIS — Y92230 Patient room in hospital as the place of occurrence of the external cause: Secondary | ICD-10-CM | POA: Diagnosis not present

## 2017-05-30 DIAGNOSIS — Z791 Long term (current) use of non-steroidal anti-inflammatories (NSAID): Secondary | ICD-10-CM

## 2017-05-30 DIAGNOSIS — R5381 Other malaise: Secondary | ICD-10-CM | POA: Diagnosis present

## 2017-05-30 DIAGNOSIS — T859XXA Unspecified complication of internal prosthetic device, implant and graft, initial encounter: Secondary | ICD-10-CM | POA: Diagnosis not present

## 2017-05-30 DIAGNOSIS — R1310 Dysphagia, unspecified: Secondary | ICD-10-CM | POA: Diagnosis not present

## 2017-05-30 DIAGNOSIS — J449 Chronic obstructive pulmonary disease, unspecified: Secondary | ICD-10-CM | POA: Diagnosis present

## 2017-05-30 DIAGNOSIS — J019 Acute sinusitis, unspecified: Secondary | ICD-10-CM | POA: Diagnosis not present

## 2017-05-30 LAB — CBC WITH DIFFERENTIAL/PLATELET
Basophils Absolute: 0 10*3/uL (ref 0.0–0.1)
Basophils Relative: 0 %
Eosinophils Absolute: 0 10*3/uL (ref 0.0–0.7)
Eosinophils Relative: 0 %
HEMATOCRIT: 33.6 % — AB (ref 36.0–46.0)
HEMOGLOBIN: 11.2 g/dL — AB (ref 12.0–15.0)
LYMPHS PCT: 4 %
Lymphs Abs: 1.3 10*3/uL (ref 0.7–4.0)
MCH: 28.7 pg (ref 26.0–34.0)
MCHC: 33.3 g/dL (ref 30.0–36.0)
MCV: 86.2 fL (ref 78.0–100.0)
MONOS PCT: 2 %
Monocytes Absolute: 0.6 10*3/uL (ref 0.1–1.0)
NEUTROS ABS: 30.1 10*3/uL — AB (ref 1.7–7.7)
NEUTROS PCT: 94 %
Platelets: 376 10*3/uL (ref 150–400)
RBC: 3.9 MIL/uL (ref 3.87–5.11)
RDW: 14.1 % (ref 11.5–15.5)
WBC: 32 10*3/uL — ABNORMAL HIGH (ref 4.0–10.5)

## 2017-05-30 LAB — COMPREHENSIVE METABOLIC PANEL
ALBUMIN: 2.7 g/dL — AB (ref 3.5–5.0)
ALT: 18 U/L (ref 14–54)
ANION GAP: 13 (ref 5–15)
AST: 26 U/L (ref 15–41)
Alkaline Phosphatase: 114 U/L (ref 38–126)
BILIRUBIN TOTAL: 0.9 mg/dL (ref 0.3–1.2)
BUN: 31 mg/dL — ABNORMAL HIGH (ref 6–20)
CO2: 25 mmol/L (ref 22–32)
Calcium: 8.6 mg/dL — ABNORMAL LOW (ref 8.9–10.3)
Chloride: 100 mmol/L — ABNORMAL LOW (ref 101–111)
Creatinine, Ser: 0.68 mg/dL (ref 0.44–1.00)
GFR calc non Af Amer: >60 mL/min (ref >60)
GLUCOSE: 102 mg/dL — AB (ref 65–99)
POTASSIUM: 3.2 mmol/L — AB (ref 3.5–5.1)
SODIUM: 138 mmol/L (ref 135–145)
Total Protein: 7.5 g/dL (ref 6.5–8.1)

## 2017-05-30 LAB — URINALYSIS, ROUTINE W REFLEX MICROSCOPIC
Glucose, UA: NEGATIVE mg/dL
Hgb urine dipstick: NEGATIVE
Ketones, ur: 15 mg/dL — AB
NITRITE: NEGATIVE
PH: 6 (ref 5.0–8.0)
Protein, ur: 30 mg/dL — AB
SPECIFIC GRAVITY, URINE: 1.015 (ref 1.005–1.030)

## 2017-05-30 LAB — URINALYSIS, MICROSCOPIC (REFLEX)

## 2017-05-30 MED ORDER — AZITHROMYCIN 500 MG IV SOLR
INTRAVENOUS | Status: AC
  Filled 2017-05-30: qty 500

## 2017-05-30 MED ORDER — DEXTROSE 5 % IV SOLN
500.0000 mg | Freq: Once | INTRAVENOUS | Status: AC
  Administered 2017-05-30: 500 mg via INTRAVENOUS
  Filled 2017-05-30: qty 500

## 2017-05-30 MED ORDER — IOPAMIDOL (ISOVUE-300) INJECTION 61%
100.0000 mL | Freq: Once | INTRAVENOUS | Status: AC | PRN
  Administered 2017-05-30: 100 mL via INTRAVENOUS

## 2017-05-30 MED ORDER — SODIUM CHLORIDE 0.9 % IV BOLUS (SEPSIS)
500.0000 mL | Freq: Once | INTRAVENOUS | Status: AC
  Administered 2017-05-30: 500 mL via INTRAVENOUS

## 2017-05-30 MED ORDER — CEFTRIAXONE SODIUM 1 G IJ SOLR
1.0000 g | Freq: Once | INTRAMUSCULAR | Status: AC
  Administered 2017-05-30: 1 g via INTRAVENOUS
  Filled 2017-05-30: qty 10

## 2017-05-30 NOTE — ED Provider Notes (Signed)
[facility] EMERGENCY DEPARTMENT Provider Note   CSN: [id number] Arrival date & time: 05/30/17  1613     History   Chief Complaint Chief Complaint  Patient presents with  . Cough    HPI Dawsyn Zurn is a 84 y.o. female.  HPI Patient presents with cough for the last couple weeks.  Had x-ray done at primary care doctor yesterday that showed multifocal pneumonia.  Started on antibiotics.  However patient has had sore throat and was unable to really take the medicines.  Decreased appetite.  Hypoxic on room air upon arrival.  Has had a cough.  Has had chills.  No fever here.  History of hyponatremia. Past Medical History:  Diagnosis Date  . Anxiety   . GERD (gastroesophageal reflux disease)   . Hypercholesterolemia   . Hypertension   . Hyponatremia   . UTI (lower urinary tract infection)     There are no active problems to display for this patient.   History reviewed. No pertinent surgical history.  OB History    No data available       Home Medications    Prior to Admission medications   Medication Sig Start Date End Date Taking? Authorizing Provider  ALPRAZolam (XANAX) 0.25 MG tablet Take 0.5 mg by mouth 2 (two) times daily as needed for anxiety.     [provider]  aspirin 81 MG tablet Take 81 mg by mouth every other day.    [provider]  ciprofloxacin (CIPRO) 250 MG tablet Take 250 mg by mouth 2 (two) times daily.    [provider]  cloNIDine (CATAPRES) 0.3 MG tablet Take 0.3 mg by mouth 3 (three) times daily.    [provider]  escitalopram (LEXAPRO) 5 MG tablet Take 5 mg by mouth daily.    [provider]  hydrALAZINE (APRESOLINE) 50 MG tablet Take 50 mg by mouth 3 (three) times daily.     [provider]  HYDROcodone-acetaminophen (HYCET) 7.5-325 mg/15 ml solution 2.5-5 ml po q 4 hrs prn pain 08/16/15   Rolland Porter, MD  naproxen (NAPROSYN) 375 MG tablet Take 1 tablet (375 mg total) by mouth 2  (two) times daily. 08/16/15   Rolland Porter, MD  nebivolol (BYSTOLIC) 2.5 MG tablet Take 2.5 mg by mouth 2 (two) times daily.    [provider]  omeprazole (PRILOSEC) 20 MG capsule Take 20 mg by mouth daily.    [provider]  traZODone (DESYREL) 50 MG tablet Take 50 mg by mouth at bedtime.    [provider]    Family History History reviewed. No pertinent family history.  Social History Social History   Tobacco Use  . Smoking status: Never Smoker  . Smokeless tobacco: Never Used  Substance Use Topics  . Alcohol use: No    Frequency: Never  . Drug use: No     Allergies   Ambien [zolpidem tartrate]; Amlodipine; Hydrochlorothiazide; and Nitrofuran derivatives   Review of Systems Review of Systems  Constitutional: Positive for appetite change.  HENT: Positive for sore throat.   Respiratory: Positive for cough and shortness of breath.   Gastrointestinal: Negative for abdominal pain.  Genitourinary: Negative for flank pain.  Musculoskeletal: Negative for back pain.  Neurological: Positive for weakness.  Hematological: Negative for adenopathy.  Psychiatric/Behavioral: Negative for confusion.     Physical Exam Updated Vital Signs BP (!) 172/71   Pulse 93   Temp 98.2 F (36.8 C) (Oral)   Resp (!) 21  Wt 53.5 kg (118 lb)   SpO2 94%   BMI 20.25 kg/m   Physical Exam  Constitutional: She appears well-developed.  Patient is somewhat ill-appearing  HENT:  Head: Normocephalic.  Mouth/Throat: No oropharyngeal exudate.  Posterior pharyngeal erythema without exudate.  Eyes: Pupils are equal, round, and reactive to light.  Neck: Neck supple.  Cardiovascular: Normal rate.  Pulmonary/Chest:  Harsh breath sounds bilaterally but greater on the right.  Musculoskeletal: She exhibits no edema.  Neurological: She is alert.  Skin: Skin is warm. Capillary refill takes less than 2 seconds.     ED Treatments / Results  Labs (all labs ordered are  listed, but only abnormal results are displayed) Labs Reviewed  COMPREHENSIVE METABOLIC PANEL - Abnormal; Notable for the following components:      Result Value   Potassium 3.2 (*)    Chloride 100 (*)    Glucose, Bld 102 (*)    BUN 31 (*)    Calcium 8.6 (*)    Albumin 2.7 (*)    All other components within normal limits  CBC WITH DIFFERENTIAL/PLATELET - Abnormal; Notable for the following components:   WBC 32.0 (*)    Hemoglobin 11.2 (*)    HCT 33.6 (*)    Neutro Abs 30.1 (*)    All other components within normal limits  URINALYSIS, ROUTINE W REFLEX MICROSCOPIC - Abnormal; Notable for the following components:   APPearance CLOUDY (*)    Bilirubin Urine SMALL (*)    Ketones, ur 15 (*)    Protein, ur 30 (*)    Leukocytes, UA TRACE (*)    All other components within normal limits  URINALYSIS, MICROSCOPIC (REFLEX) - Abnormal; Notable for the following components:   Bacteria, UA FEW (*)    Squamous Epithelial / LPF 6-30 (*)    All other components within normal limits    EKG  EKG Interpretation None       Radiology Dg Chest 2 View  Result Date: 05/30/2017 CLINICAL DATA:  Cough for 2 weeks, pneumonia EXAM: CHEST  2 VIEW COMPARISON:  05/29/2017 FINDINGS: Normal heart size, mediastinal contours, and pulmonary vascularity. Perihilar infiltrates bilaterally increased in RIGHT lower lobe increased since 05/29/2017. Question developing cavitation in the RIGHT upper lobe infiltrate seen previously adjacent to the minor fissure. Underlying emphysematous and bronchitic changes. No pleural effusion or pneumothorax. Atherosclerotic calcifications aorta. Bones demineralized. IMPRESSION: COPD changes with increased BILATERAL pulmonary infiltrates since previous exam. Question developing cavitation in the previously seen RIGHT upper lobe infiltrate adjacent to the minor fissure; this could be better assessed by CT. Electronically Signed   By: Ulyses Southward M.D.   On: 05/30/2017 17:40   Ct Chest W  Contrast  Result Date: 05/30/2017 CLINICAL DATA:  Abnormal chest x-ray EXAM: CT CHEST WITH CONTRAST TECHNIQUE: Multidetector CT imaging of the chest was performed during intravenous contrast administration. CONTRAST:  ISOVUE-300 IOPAMIDOL (ISOVUE-300) INJECTION 61% COMPARISON:  Chest x-ray 05/30/2017, CT chest 09/27/2015 FINDINGS: Cardiovascular: Nonaneurysmal aorta. Moderate aortic atherosclerosis. Coronary artery calcification. Mild cardiomegaly. No significant pericardial effusion. Mediastinum/Nodes: Multiple nodules in the left lobe of thyroid measuring up to 16 mm. Midline trachea. Multiple enlarged mediastinal lymph nodes, for example precarinal lymph node measures 11 mm. Subcarinal lymph node measures 12 mm. Esophagus within normal limits. Lungs/Pleura: Trace pleural effusions multifocal slightly nodular appearing areas of consolidation and adjacent ground-glass density in the upper lobes, right middle lobe, and bilateral lower lobes, likely reflecting multifocal pneumonia. No definite cavitary lesions are visualized. Upper Abdomen:  No acute abnormality 3.9 cm cystic lesion in the region of the pancreatic neck and proximal body with possible additional cystic lesion at the tail of the pancreas. Musculoskeletal: Kyphosis of the spine. Mild to moderate compression fracture of T7 with about 30% loss of height anteriorly. Mild to moderate compression fracture of L1 with a about 25% loss of height anteriorly, both fractures are new since prior CT from 2017. IMPRESSION: 1. Multifocal consolidations within the bilateral lungs with adjacent ground-glass density, suspicious for multifocal pneumonia. No definite cavitary lesions. Trace pleural effusions. Short interval CT follow-up in 6-12 weeks recommended following adequate antibiotic trial to ensure resolution of the consolidations and exclude underlying mass. 2. Mild mediastinal adenopathy, likely reactive 3. Suspected cystic lesions within the proximal  pancreas and pancreatic tail. When the patient is clinically stable and able to follow directions and hold their breath (preferably as an outpatient) further evaluation with dedicated abdominal MRI should be considered. 4. Mild to moderate compression fractures of T7 and L1, new since 2017 comparison CT Aortic Atherosclerosis (ICD10-I70.0). Electronically Signed   By: Jasmine PangKim  Fujinaga M.D.   On: 05/30/2017 19:59    Procedures Procedures (including critical care time)  Medications Ordered in ED Medications  cefTRIAXone (ROCEPHIN) 1 g in dextrose 5 % 50 mL IVPB (0 g Intravenous Stopped 05/30/17 1816)  azithromycin (ZITHROMAX) 500 mg in dextrose 5 % 250 mL IVPB (0 mg Intravenous Stopped 05/30/17 1958)  azithromycin (ZITHROMAX) 500 MG injection [facility]  [facility]  sodium chloride 0.9 % bolus 500 mL (0 mLs Intravenous Stopped 05/30/17 1818)  iopamidol (ISOVUE-300) 61 % injection 100 mL (100 mLs Intravenous Contrast Given 05/30/17 1923)     Initial Impression / Assessment and Plan / ED Course  I have reviewed the triage vital signs and the nursing notes.  Pertinent labs & imaging results that were available during my care of the patient were reviewed by me and considered in my medical decision making (see chart for details).     Patient with sore throat and cough.  Reportedly had negative strep test and flu test at primary care doctors.  X-ray yesterday showed pneumonia.  X-ray repeated today and showed worsening of the infiltrates.  Also one was potentially cavitary.  CT scan done and did not show cavitary lesions.  Normal lactic acid.  Not hypotensive.  Is hypoxic on room air.  Does have potential pancreatic cyst will need to get followed.  Will have patient admitted to hospital.  Family has requested [facility].  Final Clinical Impressions(s) / ED Diagnoses   Final diagnoses:  Multifocal pneumonia    ED Discharge Orders    None       Benjiman CorePickering, Kaipo Ardis, MD 05/30/17  2042

## 2017-05-30 NOTE — ED Triage Notes (Signed)
Patient has had a cough x 2 weeks. Had Chest X-ray as an outpatient   - patient has a pneumonia on x-ray

## 2017-05-30 NOTE — ED Notes (Signed)
Called [facility] line to [facility].

## 2017-05-30 NOTE — ED Notes (Signed)
Cardiac monitor has been on the Pt.   Pt. In NSR no ectopy noted

## 2017-05-30 NOTE — ED Notes (Signed)
[facility]ed [facility]

## 2017-05-30 NOTE — ED Notes (Signed)
Hold tubes and BCx1 sent to lab to hold

## 2017-05-31 ENCOUNTER — Encounter (HOSPITAL_COMMUNITY): Payer: Self-pay | Admitting: Family Medicine

## 2017-05-31 DIAGNOSIS — K869 Disease of pancreas, unspecified: Secondary | ICD-10-CM

## 2017-05-31 DIAGNOSIS — J9601 Acute respiratory failure with hypoxia: Secondary | ICD-10-CM

## 2017-05-31 DIAGNOSIS — I10 Essential (primary) hypertension: Secondary | ICD-10-CM | POA: Diagnosis present

## 2017-05-31 DIAGNOSIS — F419 Anxiety disorder, unspecified: Secondary | ICD-10-CM

## 2017-05-31 DIAGNOSIS — D649 Anemia, unspecified: Secondary | ICD-10-CM | POA: Diagnosis present

## 2017-05-31 DIAGNOSIS — E876 Hypokalemia: Secondary | ICD-10-CM | POA: Diagnosis present

## 2017-05-31 DIAGNOSIS — J189 Pneumonia, unspecified organism: Secondary | ICD-10-CM

## 2017-05-31 LAB — CBC WITH DIFFERENTIAL/PLATELET
BASOS ABS: 0 10*3/uL (ref 0.0–0.1)
Basophils Relative: 0 %
EOS ABS: 0 10*3/uL (ref 0.0–0.7)
Eosinophils Relative: 0 %
HCT: 29.7 % — ABNORMAL LOW (ref 36.0–46.0)
Hemoglobin: 9.6 g/dL — ABNORMAL LOW (ref 12.0–15.0)
LYMPHS PCT: 4 %
Lymphs Abs: 1 10*3/uL (ref 0.7–4.0)
MCH: 27.8 pg (ref 26.0–34.0)
MCHC: 32.3 g/dL (ref 30.0–36.0)
MCV: 86.1 fL (ref 78.0–100.0)
Monocytes Absolute: 0.5 10*3/uL (ref 0.1–1.0)
Monocytes Relative: 2 %
NEUTROS ABS: 23.9 10*3/uL — AB (ref 1.7–7.7)
Neutrophils Relative %: 94 %
PLATELETS: 310 10*3/uL (ref 150–400)
RBC: 3.45 MIL/uL — ABNORMAL LOW (ref 3.87–5.11)
RDW: 14.4 % (ref 11.5–15.5)
SMEAR REVIEW: ADEQUATE
WBC: 25.4 10*3/uL — ABNORMAL HIGH (ref 4.0–10.5)

## 2017-05-31 LAB — BASIC METABOLIC PANEL
ANION GAP: 13 (ref 5–15)
BUN: 21 mg/dL — ABNORMAL HIGH (ref 6–20)
CALCIUM: 7.9 mg/dL — AB (ref 8.9–10.3)
CO2: 23 mmol/L (ref 22–32)
Chloride: 102 mmol/L (ref 101–111)
Creatinine, Ser: 0.63 mg/dL (ref 0.44–1.00)
GLUCOSE: 95 mg/dL (ref 65–99)
Potassium: 3 mmol/L — ABNORMAL LOW (ref 3.5–5.1)
Sodium: 138 mmol/L (ref 135–145)

## 2017-05-31 LAB — INFLUENZA PANEL BY PCR (TYPE A & B)
INFLBPCR: NEGATIVE
Influenza A By PCR: NEGATIVE

## 2017-05-31 LAB — MAGNESIUM: Magnesium: 2.5 mg/dL — ABNORMAL HIGH (ref 1.7–2.4)

## 2017-05-31 LAB — PROCALCITONIN: PROCALCITONIN: 0.74 ng/mL

## 2017-05-31 MED ORDER — ACETAMINOPHEN 650 MG RE SUPP: 650.0000 mg | Freq: Four times a day (QID) | RECTAL | Status: DC | PRN

## 2017-05-31 MED ORDER — MAGNESIUM SULFATE 2 GM/50ML IV SOLN
2.0000 g | Freq: Once | INTRAVENOUS | Status: AC
  Administered 2017-05-31: 2 g via INTRAVENOUS
  Filled 2017-05-31: qty 50

## 2017-05-31 MED ORDER — HYDRALAZINE HCL 20 MG/ML IJ SOLN
10.0000 mg | INTRAMUSCULAR | Status: DC | PRN
  Administered 2017-05-31 – 2017-06-02 (×2): 10 mg via INTRAVENOUS
  Filled 2017-05-31 (×2): qty 1

## 2017-05-31 MED ORDER — CLONIDINE HCL 0.2 MG PO TABS
0.3000 mg | ORAL_TABLET | Freq: Two times a day (BID) | ORAL | Status: DC
  Administered 2017-05-31 – 2017-06-01 (×5): 0.3 mg via ORAL
  Filled 2017-05-31 (×6): qty 1

## 2017-05-31 MED ORDER — ENOXAPARIN SODIUM 40 MG/0.4ML ~~LOC~~ SOLN
40.0000 mg | SUBCUTANEOUS | Status: DC
  Administered 2017-05-31 – 2017-06-10 (×11): 40 mg via SUBCUTANEOUS
  Filled 2017-05-31 (×11): qty 0.4

## 2017-05-31 MED ORDER — ASPIRIN EC 81 MG PO TBEC
81.0000 mg | DELAYED_RELEASE_TABLET | ORAL | Status: DC
  Administered 2017-05-31 – 2017-06-10 (×5): 81 mg via ORAL
  Filled 2017-05-31 (×7): qty 1

## 2017-05-31 MED ORDER — POTASSIUM CHLORIDE IN NACL 40-0.9 MEQ/L-% IV SOLN
INTRAVENOUS | Status: AC
  Administered 2017-05-31: 75 mL/h via INTRAVENOUS
  Filled 2017-05-31: qty 1000

## 2017-05-31 MED ORDER — NYSTATIN 100000 UNIT/ML MT SUSP
5.0000 mL | Freq: Four times a day (QID) | OROMUCOSAL | Status: DC
  Administered 2017-05-31 – 2017-06-01 (×6): 500000 [IU] via OROMUCOSAL
  Filled 2017-05-31 (×8): qty 5

## 2017-05-31 MED ORDER — ONDANSETRON HCL 4 MG PO TABS: 4.0000 mg | ORAL_TABLET | Freq: Four times a day (QID) | ORAL | Status: DC | PRN

## 2017-05-31 MED ORDER — ENSURE ENLIVE PO LIQD
237.0000 mL | Freq: Two times a day (BID) | ORAL | Status: DC
  Administered 2017-05-31 – 2017-06-01 (×2): 237 mL via ORAL
  Filled 2017-05-31: qty 237

## 2017-05-31 MED ORDER — ESCITALOPRAM OXALATE 10 MG PO TABS: 5.0000 mg | ORAL_TABLET | Freq: Every day | ORAL | Status: DC

## 2017-05-31 MED ORDER — DEXTROSE 5 % IV SOLN
1.0000 g | INTRAVENOUS | Status: DC
  Administered 2017-05-31 – 2017-06-01 (×2): 1 g via INTRAVENOUS
  Filled 2017-05-31 (×3): qty 10

## 2017-05-31 MED ORDER — TRAZODONE HCL 50 MG PO TABS: 50.0000 mg | ORAL_TABLET | Freq: Every evening | ORAL | Status: DC | PRN

## 2017-05-31 MED ORDER — PROMETHAZINE-CODEINE 6.25-10 MG/5ML PO SYRP: 5.0000 mL | ORAL_SOLUTION | Freq: Four times a day (QID) | ORAL | Status: DC | PRN

## 2017-05-31 MED ORDER — ONDANSETRON HCL 4 MG/2ML IJ SOLN: 4.0000 mg | Freq: Four times a day (QID) | INTRAMUSCULAR | Status: DC | PRN

## 2017-05-31 MED ORDER — SODIUM CHLORIDE 0.9% FLUSH
3.0000 mL | Freq: Two times a day (BID) | INTRAVENOUS | Status: DC
  Administered 2017-05-31 – 2017-06-10 (×12): 3 mL via INTRAVENOUS

## 2017-05-31 MED ORDER — AZITHROMYCIN 500 MG IV SOLR
500.0000 mg | INTRAVENOUS | Status: DC
  Administered 2017-05-31 – 2017-06-02 (×3): 500 mg via INTRAVENOUS
  Filled 2017-05-31 (×3): qty 500

## 2017-05-31 MED ORDER — HYDROCODONE-ACETAMINOPHEN 5-325 MG PO TABS: 1.0000 | ORAL_TABLET | ORAL | Status: DC | PRN

## 2017-05-31 MED ORDER — POTASSIUM CHLORIDE 20 MEQ/15ML (10%) PO SOLN
40.0000 meq | Freq: Once | ORAL | Status: AC
  Administered 2017-05-31: 40 meq via ORAL
  Filled 2017-05-31: qty 30

## 2017-05-31 MED ORDER — BISACODYL 5 MG PO TBEC
5.0000 mg | DELAYED_RELEASE_TABLET | Freq: Every day | ORAL | Status: DC | PRN
  Administered 2017-06-05: 5 mg via ORAL
  Filled 2017-05-31: qty 1

## 2017-05-31 MED ORDER — NEBIVOLOL HCL 2.5 MG PO TABS: 2.5000 mg | ORAL_TABLET | Freq: Two times a day (BID) | ORAL | Status: DC

## 2017-05-31 MED ORDER — POTASSIUM CHLORIDE CRYS ER 20 MEQ PO TBCR
40.0000 meq | EXTENDED_RELEASE_TABLET | Freq: Once | ORAL | Status: DC
  Filled 2017-05-31: qty 2

## 2017-05-31 MED ORDER — PANTOPRAZOLE SODIUM 40 MG PO TBEC
40.0000 mg | DELAYED_RELEASE_TABLET | Freq: Every day | ORAL | Status: DC
  Administered 2017-05-31 – 2017-06-01 (×2): 40 mg via ORAL
  Filled 2017-05-31 (×3): qty 1

## 2017-05-31 MED ORDER — ACETAMINOPHEN 325 MG PO TABS
650.0000 mg | ORAL_TABLET | Freq: Four times a day (QID) | ORAL | Status: DC | PRN
Start: 2017-05-31 — End: 2017-06-09
  Administered 2017-06-05 – 2017-06-07 (×2): 650 mg via ORAL
  Filled 2017-05-31 (×2): qty 2

## 2017-05-31 MED ORDER — MAGIC MOUTHWASH W/LIDOCAINE
5.0000 mL | Freq: Four times a day (QID) | ORAL | Status: DC | PRN
  Administered 2017-05-31 – 2017-06-01 (×3): 5 mL via ORAL
  Filled 2017-05-31 (×3): qty 5

## 2017-05-31 MED ORDER — ALPRAZOLAM 0.5 MG PO TABS: 0.5000 mg | ORAL_TABLET | Freq: Two times a day (BID) | ORAL | Status: DC | PRN

## 2017-05-31 MED ORDER — SENNOSIDES-DOCUSATE SODIUM 8.6-50 MG PO TABS: 1.0000 | ORAL_TABLET | Freq: Every evening | ORAL | Status: DC | PRN

## 2017-05-31 MED ORDER — HYDRALAZINE HCL 50 MG PO TABS
100.0000 mg | ORAL_TABLET | Freq: Three times a day (TID) | ORAL | Status: DC
  Administered 2017-05-31 – 2017-06-01 (×6): 100 mg via ORAL
  Filled 2017-05-31 (×7): qty 2

## 2017-05-31 NOTE — Progress Notes (Signed)
Initial Nutrition Assessment  DOCUMENTATION CODES:   Severe malnutrition in context of chronic illness, Underweight  INTERVENTION:   Continue on Ensure Enlive po BID, each supplement provides 350 kcal and 20 grams of protein  Magic cup, each supplement provides 290 kcal and 9 grams of protein    NUTRITION DIAGNOSIS:   Severe Malnutrition related to chronic illness as evidenced by severe fat depletion, severe muscle depletion.   GOAL:   Patient will meet greater than or equal to 90% of their needs   MONITOR:   PO intake, Supplement acceptance, Labs, Weight trends, I & O's  REASON FOR ASSESSMENT:   Malnutrition Screening Tool    ASSESSMENT:   84 yo female admitted 2/7 with acute hypoxic respiratory failure secondary to pneumonia, hypokalemia, incidental pancreatic lesion. PMH SIADH, COPD, generalized anxiety disorder, HTN, GERD.   Pt had extreme difficulty communicating. Appeared to be in pain and was experiencing body tremors.   Pt's husband and daughter were in the room and able to give pt hx PTA. Pt has not eaten since getting sick ~ 1 wk ago. Husband reported that he could only get her to eat a tablespoon of jello. Pt also has not been drinking fluids.  Pt is also currently not tolerating PO intake of food or liquids. Daughter and husband deny "dysphagia"; daughter was very emphatic that pt just had sore throat, but pt has been coughing when drinking water.   RN reported that he spoke with the attending and suggested a SLP consult.  Prior to current acute illness pt's appetite and PO intake has been decreased. Husband reported pt's significant weight loss six months ago. This conflicts with medical records from chart review encounters. (111 lb May 2018, 115 lb May 2017). Daughter also disputed significant wt loss and reported that pt's intake for past two years has gradually decreased; it was "better over Christmas", but has gotten worse since  December.  Husband/daughter report that pt has been declining for past two years.    Pt's husband and daughter suggested pt may tolerate Magic cup.   Patient is UNDERWEIGHT Given pt's severe malnutrition (muscle/fat), dehydration, and current inability to tolerate PO intake, Cortrak feeding may be indicated.  Recommend checking phosphorus given pt NPO over past week for refeeding.  Medications include:  IV NaCl with KCl PO KCl  hydrALAZINE (side effects N/V constipation, loss of appetite) cefTRIAXone (side effects N/V constipation, loss of appetite) azithromycin (side effects N/V constipation, loss of appetite)   Labs reviewed.   NUTRITION - FOCUSED PHYSICAL EXAM:    Most Recent Value  Orbital Region  Severe depletion  Upper Arm Region  Severe depletion  Thoracic and Lumbar Region  Unable to assess  Buccal Region  Severe depletion  Temple Region  Severe depletion  Clavicle Bone Region  Severe depletion  Clavicle and Acromion Bone Region  Severe depletion  Scapular Bone Region  Unable to assess  Dorsal Hand  Unable to assess  Patellar Region  Severe depletion  Anterior Thigh Region  Severe depletion  Posterior Calf Region  Severe depletion  Edema (RD Assessment)  None  Hair  Unable to assess  Eyes  Reviewed  Mouth  Unable to assess  Skin  Reviewed  Nails  Reviewed       Diet Order:  Diet regular Room service appropriate? Yes; Fluid consistency: Thin  EDUCATION NEEDS:   No education needs have been identified at this time  Skin:  Skin Assessment: Reviewed RN Assessment  Last BM:  none noted (smear with incontinence)  Height:   Ht Readings from Last 1 Encounters:  05/31/17 5\' 3"  (1.6 m)    Weight:   Wt Readings from Last 1 Encounters:  05/31/17 104 lb 4.4 oz (47.3 kg)    Ideal Body Weight:  52.2 kg  BMI:  Body mass index is 18.47 kg/m.  Estimated Nutritional Needs:   Kcal:  1,200-1,400 kcal  Protein:  60-70 grams  Fluid:  Per MD (strict  I&O)    Marjie Skiffherie N. Naman Spychalski, MS Dietetic Intern Pager: [phone]

## 2017-05-31 NOTE — Progress Notes (Signed)
Patient arrived to the unit via [facility] from [facility].  Patient is alert and oriented with no complaints of pain.  Skin assessment complete.  No skin issues.  IV intact  To the right antecubital area.  Educated the patient and the family on how to reach the staff on the unit.  Lowered the bed, activated the bed alarm and place the call light within reach.  Will notify admissions to notify patient arrival.

## 2017-05-31 NOTE — Progress Notes (Addendum)
Pt refused to take PO potassium tablet states that they are too big. Message sent to pharmacist to change it to liquid form.  Colleen Canesar Jhamal Plucinski, RN

## 2017-05-31 NOTE — Progress Notes (Signed)
Patient BP 203/79.  Administered  The PRN hydralazine 10 MG.  Patient BP 196/69.  Notified the NP X . [place].  Will continue to monitor the patient and notify as needed.

## 2017-05-31 NOTE — H&P (Signed)
History and Physical    Sierra Giles ZOX:[id number] DOB: 10/03/1926 DOA: 05/30/2017  PCP: Cheron SchaumannVelazquez, Gretchen Y., MD   [facility]   Chief Complaint: Cough, SOB   HPI: Sierra Giles is a 84 y.o. female with medical history significant for generalized anxiety disorder and hypertension, now presenting to the emergency department for evaluation of cough, shortness of breath, and outpatient chest x-ray suggestive of pneumonia.  Patient reports that she had been in her usual state until approximately 2 weeks ago when she noted the insidious development of cough and dyspnea.  Symptoms have worsened significantly over the past several days, she saw her PCP yesterday for these complaints, was diagnosed with pneumonia, and started on an antibiotic.  She reports a sore throat that has made it difficult to tolerate the oral antibiotic.  She reports that rapid strep and influenza PCR were negative at her PCPs clinic.  She denies fevers, chills, chest pain, or leg swelling or tenderness.  Reports that her cough is nonproductive.  ED Course: Upon arrival to the ED, patient is found to be afebrile, saturating 88% on room air, tachypneic, and hypertensive.  Chest x-ray is notable for COPD changes and bilateral infiltrates.  Chemistry panel is notable for a potassium of 3.2 and elevated BUN to creatinine ratio.  CBC features a leukocytosis to 37,000 and a normocytic anemia with hemoglobin of 11.2.  CT chest was performed and reveals a multifocal consolidation bilaterally suspicious for multifocal pneumonia.  Patient was given 500 cc normal saline and treated with empiric Rocephin and azithromycin.  She remains hemodynamically stable and will be admitted for ongoing evaluation and management of multifocal pneumonia with hypoxia.  Review of Systems:  All other systems reviewed and apart from HPI, are negative.  Past Medical History:  Diagnosis Date  . Anxiety   . GERD  (gastroesophageal reflux disease)   . Hypercholesterolemia   . Hypertension   . Hyponatremia   . UTI (lower urinary tract infection)     History reviewed. No pertinent surgical history.   reports that  has never smoked. she has never used smokeless tobacco. She reports that she does not drink alcohol or use drugs.  Allergies  Allergen Reactions  . Ambien [Zolpidem Tartrate]   . Amlodipine   . Hydrochlorothiazide   . Nitrofuran Derivatives     History reviewed. No pertinent family history.   Prior to Admission medications   Medication Sig Start Date End Date Taking? Authorizing Provider  ALPRAZolam (XANAX) 0.25 MG tablet Take 0.5 mg by mouth 2 (two) times daily as needed for anxiety.     [provider]  aspirin 81 MG tablet Take 81 mg by mouth every other day.    [provider]  ciprofloxacin (CIPRO) 250 MG tablet Take 250 mg by mouth 2 (two) times daily.    [provider]  cloNIDine (CATAPRES) 0.3 MG tablet Take 0.3 mg by mouth 3 (three) times daily.    [provider]  escitalopram (LEXAPRO) 5 MG tablet Take 5 mg by mouth daily.    [provider]  hydrALAZINE (APRESOLINE) 50 MG tablet Take 50 mg by mouth 3 (three) times daily.     [provider]  HYDROcodone-acetaminophen (HYCET) 7.5-325 mg/15 ml solution 2.5-5 ml po q 4 hrs prn pain 08/16/15   Rolland PorterJames, Mark, MD  naproxen (NAPROSYN) 375 MG tablet Take 1 tablet (375 mg total) by mouth 2 (two) times daily. 08/16/15   Rolland PorterJames, Mark,  MD  nebivolol (BYSTOLIC) 2.5 MG tablet Take 2.5 mg by mouth 2 (two) times daily.    [provider]  omeprazole (PRILOSEC) 20 MG capsule Take 20 mg by mouth daily.    [provider]  traZODone (DESYREL) 50 MG tablet Take 50 mg by mouth at bedtime.    [provider]    Physical Exam: Vitals:   05/30/17 2200 05/30/17 2230 05/30/17 2300 05/31/17 0037  BP: (!) 162/76  (!) 176/74 (!) 189/74  Pulse: 92  95 88  Resp: (!) 21   (!) 21 (!) 22  Temp:  100.1 F (37.8 C)  97.8 F (36.6 C)  TempSrc:    Oral  SpO2: 95%  94% 94%  Weight:    47.3 kg (104 lb 4.4 oz)  Height:    5\' 3"  (1.6 m)      Constitutional: NAD, calm, appears frail and uncomfortable  Eyes: PERTLA, lids and conjunctivae normal ENMT: Mucous membranes are moist. Posterior pharynx clear of any exudate or lesions.   Neck: normal, supple, no masses, no thyromegaly Respiratory: Tachypnea, increased WOB, rhonchi bilaterally. No pallor or cyanosis.   Cardiovascular: S1 & S2 heard, regular rate and rhythm. No extremity edema. No significant JVD. Abdomen: No distension, no tenderness, no masses palpated. Bowel sounds normal.  Musculoskeletal: no clubbing / cyanosis. No joint deformity upper and lower extremities.   Skin: no significant rashes, lesions, ulcers. Poor turgor. Neurologic: CN 2-12 grossly intact. Sensation intact. Strength 5/5 in all 4 limbs. Gross hearing deficit.  Psychiatric: Alert and oriented x 3. Calm, cooperative.     Labs on Admission: I have personally reviewed following labs and imaging studies  CBC: Recent Labs  Lab 05/30/17 1642  WBC 32.0*  NEUTROABS 30.1*  HGB 11.2*  HCT 33.6*  MCV 86.2  PLT 376   Basic Metabolic Panel: Recent Labs  Lab 05/30/17 1642  NA 138  K 3.2*  CL 100*  CO2 25  GLUCOSE 102*  BUN 31*  CREATININE 0.68  CALCIUM 8.6*   GFR: Estimated Creatinine Clearance: 36.3 mL/min (by C-G formula based on SCr of 0.68 mg/dL). Liver Function Tests: Recent Labs  Lab 05/30/17 1642  AST 26  ALT 18  ALKPHOS 114  BILITOT 0.9  PROT 7.5  ALBUMIN 2.7*   No results for input(s): LIPASE, AMYLASE in the last 168 hours. No results for input(s): AMMONIA in the last 168 hours. Coagulation Profile: No results for input(s): INR, PROTIME in the last 168 hours. Cardiac Enzymes: No results for input(s): CKTOTAL, CKMB, CKMBINDEX, TROPONINI in the last 168 hours. BNP (last 3 results) No results for input(s):  PROBNP in the last 8760 hours. HbA1C: No results for input(s): HGBA1C in the last 72 hours. CBG: No results for input(s): GLUCAP in the last 168 hours. Lipid Profile: No results for input(s): CHOL, HDL, LDLCALC, TRIG, CHOLHDL, LDLDIRECT in the last 72 hours. Thyroid Function Tests: No results for input(s): TSH, T4TOTAL, FREET4, T3FREE, THYROIDAB in the last 72 hours. Anemia Panel: No results for input(s): VITAMINB12, FOLATE, FERRITIN, TIBC, IRON, RETICCTPCT in the last 72 hours. Urine analysis:    Component Value Date/Time   COLORURINE YELLOW 05/30/2017 1818   APPEARANCEUR CLOUDY (A) 05/30/2017 1818   LABSPEC 1.015 05/30/2017 1818   PHURINE 6.0 05/30/2017 1818   GLUCOSEU NEGATIVE 05/30/2017 1818   HGBUR NEGATIVE 05/30/2017 1818   BILIRUBINUR SMALL (A) 05/30/2017 1818   KETONESUR 15 (A) 05/30/2017 1818   PROTEINUR 30 (A) 05/30/2017 1818   UROBILINOGEN  0.2 07/14/2014 2012   NITRITE NEGATIVE 05/30/2017 1818   LEUKOCYTESUR TRACE (A) 05/30/2017 1818   Sepsis Labs: @LABRCNTIP (procalcitonin:4,lacticidven:4) )No results found for this or any previous visit (from the past 240 hour(s)).   Radiological Exams on Admission: Dg Chest 2 View  Result Date: 05/30/2017 CLINICAL DATA:  Cough for 2 weeks, pneumonia EXAM: CHEST  2 VIEW COMPARISON:  05/29/2017 FINDINGS: Normal heart size, mediastinal contours, and pulmonary vascularity. Perihilar infiltrates bilaterally increased in RIGHT lower lobe increased since 05/29/2017. Question developing cavitation in the RIGHT upper lobe infiltrate seen previously adjacent to the minor fissure. Underlying emphysematous and bronchitic changes. No pleural effusion or pneumothorax. Atherosclerotic calcifications aorta. Bones demineralized. IMPRESSION: COPD changes with increased BILATERAL pulmonary infiltrates since previous exam. Question developing cavitation in the previously seen RIGHT upper lobe infiltrate adjacent to the minor fissure; this could be better  assessed by CT. Electronically Signed   By: Ulyses Southward M.D.   On: 05/30/2017 17:40   Ct Chest W Contrast  Result Date: 05/30/2017 CLINICAL DATA:  Abnormal chest x-ray EXAM: CT CHEST WITH CONTRAST TECHNIQUE: Multidetector CT imaging of the chest was performed during intravenous contrast administration. CONTRAST:  ISOVUE-300 IOPAMIDOL (ISOVUE-300) INJECTION 61% COMPARISON:  Chest x-ray 05/30/2017, CT chest 09/27/2015 FINDINGS: Cardiovascular: Nonaneurysmal aorta. Moderate aortic atherosclerosis. Coronary artery calcification. Mild cardiomegaly. No significant pericardial effusion. Mediastinum/Nodes: Multiple nodules in the left lobe of thyroid measuring up to 16 mm. Midline trachea. Multiple enlarged mediastinal lymph nodes, for example precarinal lymph node measures 11 mm. Subcarinal lymph node measures 12 mm. Esophagus within normal limits. Lungs/Pleura: Trace pleural effusions multifocal slightly nodular appearing areas of consolidation and adjacent ground-glass density in the upper lobes, right middle lobe, and bilateral lower lobes, likely reflecting multifocal pneumonia. No definite cavitary lesions are visualized. Upper Abdomen: No acute abnormality 3.9 cm cystic lesion in the region of the pancreatic neck and proximal body with possible additional cystic lesion at the tail of the pancreas. Musculoskeletal: Kyphosis of the spine. Mild to moderate compression fracture of T7 with about 30% loss of height anteriorly. Mild to moderate compression fracture of L1 with a about 25% loss of height anteriorly, both fractures are new since prior CT from 2017. IMPRESSION: 1. Multifocal consolidations within the bilateral lungs with adjacent ground-glass density, suspicious for multifocal pneumonia. No definite cavitary lesions. Trace pleural effusions. Short interval CT follow-up in 6-12 weeks recommended following adequate antibiotic trial to ensure resolution of the consolidations and exclude underlying mass.  2. Mild mediastinal adenopathy, likely reactive 3. Suspected cystic lesions within the proximal pancreas and pancreatic tail. When the patient is clinically stable and able to follow directions and hold their breath (preferably as an outpatient) further evaluation with dedicated abdominal MRI should be considered. 4. Mild to moderate compression fractures of T7 and L1, new since 2017 comparison CT Aortic Atherosclerosis (ICD10-I70.0). Electronically Signed   By: Jasmine Pang M.D.   On: 05/30/2017 19:59    EKG: Not performed.   Assessment/Plan  1. Multifocal pneumonia; acute hypoxic respiratory failure  - Presents with 2 weeks of cough, SOB - Found to be hypoxic, tachypneic, with marked leukocytosis, and multifocal PNA on imaging  - Treated in ED with Rocephin, azithromycin, and supplemental O2  - Collect blood and sputum cultures, check strep pneumo antigen, trend procalcitonin  - Continue current abx, continue supplemental O2   2. Hypokalemia  - Serum potassium is 3.2 on admission  - KCl added to IVF  - Repeat chemistries in am  3. Hypertension  - Continue hydralazine and clonidine    4. Anxiety  - Stable  - Continue Lexapro and low-dose Xanax    5. Incidental pancreatic lesion - Follow-up with outpatient MRI recommended     DVT prophylaxis: Lovenox Code Status: Full  Family Communication: Husband updated at bedside Disposition Plan: Admit to telemetry Consults called: None Admission status: Inpatient    Briscoe Deutscher, MD [facility] Pager [phone]  If 7PM-7AM, please contact night-coverage [email or web address] Password [email or web address]  05/31/2017, 2:18 AM

## 2017-05-31 NOTE — Progress Notes (Signed)
   Follow Up Note  HPI: Please refer to H&P done earlier on this morning  Patient admitted for multifocal pneumonia, currently on IV ceftriaxone plus azithromycin. Saw patient today, no increased work of breathing noted.  Patient still complains of sore throat, pain with swallowing.  Nystatin started.  Patient otherwise stable, still frail looking and unable to tolerate due to pain upon swallowing.  Nutrition consulted.  Denies any chest pain, worsening shortness of breath, abdominal pain, nausea/vomiting, fever/chills, dizziness.   Exam: CV: S1-S2 present, no added heart sounds Lungs: Decreased breath sounds bilaterally with rhonchi noted as well Abd: Soft, nontender, nondistended, bowel sounds present Ext: No bilateral pedal edema noted  Present on Admission: . CAP (community acquired pneumonia) . Acute respiratory failure with hypoxia (HCC) . Normocytic anemia . Hypokalemia . Pancreatic lesion . Anxiety . Hypertension   Disposition: Continue current management, may escalate antibiotics if not improving clinically.

## 2017-06-01 DIAGNOSIS — A419 Sepsis, unspecified organism: Principal | ICD-10-CM

## 2017-06-01 LAB — GLUCOSE, CAPILLARY: GLUCOSE-CAPILLARY: 110 mg/dL — AB (ref 65–99)

## 2017-06-01 LAB — CBC WITH DIFFERENTIAL/PLATELET
BASOS ABS: 0 10*3/uL (ref 0.0–0.1)
BASOS PCT: 0 %
EOS ABS: 0.1 10*3/uL (ref 0.0–0.7)
EOS PCT: 0 %
HCT: 29.1 % — ABNORMAL LOW (ref 36.0–46.0)
Hemoglobin: 9.2 g/dL — ABNORMAL LOW (ref 12.0–15.0)
Lymphocytes Relative: 4 %
Lymphs Abs: 0.9 10*3/uL (ref 0.7–4.0)
MCH: 27.6 pg (ref 26.0–34.0)
MCHC: 31.6 g/dL (ref 30.0–36.0)
MCV: 87.4 fL (ref 78.0–100.0)
MONO ABS: 0.6 10*3/uL (ref 0.1–1.0)
Monocytes Relative: 3 %
Neutro Abs: 20.1 10*3/uL — ABNORMAL HIGH (ref 1.7–7.7)
Neutrophils Relative %: 93 %
PLATELETS: 324 10*3/uL (ref 150–400)
RBC: 3.33 MIL/uL — ABNORMAL LOW (ref 3.87–5.11)
RDW: 14.4 % (ref 11.5–15.5)
WBC: 21.6 10*3/uL — AB (ref 4.0–10.5)

## 2017-06-01 LAB — BASIC METABOLIC PANEL
ANION GAP: 11 (ref 5–15)
BUN: 20 mg/dL (ref 6–20)
CALCIUM: 7.8 mg/dL — AB (ref 8.9–10.3)
CO2: 24 mmol/L (ref 22–32)
Chloride: 103 mmol/L (ref 101–111)
Creatinine, Ser: 0.55 mg/dL (ref 0.44–1.00)
GLUCOSE: 112 mg/dL — AB (ref 65–99)
Potassium: 3.5 mmol/L (ref 3.5–5.1)
SODIUM: 138 mmol/L (ref 135–145)

## 2017-06-01 LAB — URINE CULTURE: Culture: <10000 — AB

## 2017-06-01 LAB — PROCALCITONIN: Procalcitonin: 0.45 ng/mL

## 2017-06-01 MED ORDER — BLISTEX MEDICATED EX OINT
TOPICAL_OINTMENT | CUTANEOUS | Status: DC | PRN
  Filled 2017-06-01: qty 6.3

## 2017-06-01 MED ORDER — IPRATROPIUM-ALBUTEROL 0.5-2.5 (3) MG/3ML IN SOLN
3.0000 mL | Freq: Four times a day (QID) | RESPIRATORY_TRACT | Status: DC
  Administered 2017-06-02 (×3): 3 mL via RESPIRATORY_TRACT
  Filled 2017-06-01 (×4): qty 3

## 2017-06-01 MED ORDER — LORAZEPAM 0.5 MG PO TABS
0.5000 mg | ORAL_TABLET | Freq: Two times a day (BID) | ORAL | Status: DC | PRN
Start: 2017-06-01 — End: 2017-06-04

## 2017-06-01 MED ORDER — SODIUM CHLORIDE 0.9 % IV SOLN
1000.0000 mL | INTRAVENOUS | Status: DC
  Administered 2017-06-01: 1000 mL via INTRAVENOUS

## 2017-06-01 MED ORDER — LIP MEDEX EX OINT
TOPICAL_OINTMENT | CUTANEOUS | Status: DC | PRN
  Filled 2017-06-01: qty 7

## 2017-06-01 MED ORDER — PHENOL 1.4 % MT LIQD: 2.0000 | OROMUCOSAL | Status: DC | PRN

## 2017-06-01 NOTE — Progress Notes (Signed)
PROGRESS NOTE  Sierra HackerLamarie Kirtz UEA:[id number] DOB: 03/16/1927 DOA: 05/30/2017 PCP: Cheron SchaumannVelazquez, Gretchen Y., MD  HPI/Recap of past 24 hours: Sierra Giles is a 84 y.o. female with medical history significant for generalized anxiety disorder and hypertension, now presenting to the emergency department for evaluation of cough, SOB and outpatient chest x-ray suggestive of pneumonia with ongoing symptoms of cough, dyspnea and odynophagia for 2 weeks which progressively got worse. Pt was unable to tolerate her outpt med prescribed due to odynophagia. A rapid strep and influenza PCR were negative at her PCPs clinic. In the ED, patient was saturating 88% on room air, tachypneic, and hypertensive. CT chest was performed and reveals a multifocal consolidation bilaterally suspicious for multifocal pneumonia. Pt was admitted for further management.  Today, pt reported feeling the same, denies worsening. Still c/o odynophagia, with very little improvement. Pt was able to tolerate orally today, but was noted to be coughing. Pt denies any chest pain, SOB, abdominal pain, fever/chills, N/V/D.   Assessment/Plan: Principal Problem:   CAP (community acquired pneumonia) Active Problems:   Acute respiratory failure with hypoxia (HCC)   Normocytic anemia   Hypokalemia   Pancreatic lesion   Anxiety   Hypertension  Sepsis due to multifocal CAP Afebrile, resolving leukocytosis CT showing multifocal pneumonia BC, NGTD, procalcitonin trending downwards, influenza neg Continue IV Ceftriaxone, IV Azithromycin  Acute hypoxic resp failure due to multifocal CAP Still requiring O2, intermittently tachypneic CT as above O2, duoneb SLP to assess aspiration risk  Odynophagia ??oral thrush noted on exam Started on nystatin suspension, magic mouthwash/lidocaine, chloraseptic mouth spray  Hypertension Stable Continue home hydralazine and clonidine   Hypokalemia  Resolved Daily BMET   Anxiety  Continue  Lexapro and low-dose Xanax    Incidental pancreatic lesion Follow-up with outpatient MRI recommended       Code Status: Full  Family Communication: Discussed with son who is a cardiologist, husband, granddaughter  Disposition Plan: To be determined   Consultants:  None  Procedures:  None  Antimicrobials:  IV ceftriaxone  IV azithromycin  DVT prophylaxis: Lovenox   Objective: Vitals:   06/01/17 0500 06/01/17 0611 06/01/17 0649 06/01/17 1302  BP:  (!) 199/77 (!) 172/85 (!) 166/68  Pulse:  89 99 78  Resp:  18  20  Temp:  98.7 F (37.1 C)  97.7 F (36.5 C)  TempSrc:  Oral  Oral  SpO2:  97%  100%  Weight: 48.2 kg (106 lb 4.2 oz)     Height:        Intake/Output Summary (Last 24 hours) at 06/01/2017 1641 Last data filed at 06/01/2017 1524 Gross per 24 hour  Intake 50 ml  Output -  Net 50 ml   Filed Weights   05/31/17 0037 05/31/17 0447 06/01/17 0500  Weight: 47.3 kg (104 lb 4.4 oz) 47.3 kg (104 lb 4.4 oz) 48.2 kg (106 lb 4.2 oz)    Exam:   General: Alert, awake, appears frail, ill-appearing anxious  Cardiovascular: S1, S2 present, no added heart sound  Respiratory: Rhonchi noted bilaterally  Abdomen: Soft, nondistended, nontender, bowel sounds present  Musculoskeletal: No bilateral pedal edema  Skin: Normal  Psychiatry: Mildly anxious   Data Reviewed: CBC: Recent Labs  Lab 05/30/17 1642 05/31/17 0421 06/01/17 0153  WBC 32.0* 25.4* 21.6*  NEUTROABS 30.1* 23.9* 20.1*  HGB 11.2* 9.6* 9.2*  HCT 33.6* 29.7* 29.1*  MCV 86.2 86.1 87.4  PLT 376 310 324   Basic Metabolic Panel: Recent Labs  Lab 05/30/17 1642 05/31/17  0421 06/01/17 0153  NA 138 138 138  K 3.2* 3.0* 3.5  CL 100* 102 103  CO2 25 23 24   GLUCOSE 102* 95 112*  BUN 31* 21* 20  CREATININE 0.68 0.63 0.55  CALCIUM 8.6* 7.9* 7.8*  MG  --  2.5*  --    GFR: Estimated Creatinine Clearance: 37 mL/min (by C-G formula based on SCr of 0.55 mg/dL). Liver Function Tests: Recent  Labs  Lab 05/30/17 1642  AST 26  ALT 18  ALKPHOS 114  BILITOT 0.9  PROT 7.5  ALBUMIN 2.7*   No results for input(s): LIPASE, AMYLASE in the last 168 hours. No results for input(s): AMMONIA in the last 168 hours. Coagulation Profile: No results for input(s): INR, PROTIME in the last 168 hours. Cardiac Enzymes: No results for input(s): CKTOTAL, CKMB, CKMBINDEX, TROPONINI in the last 168 hours. BNP (last 3 results) No results for input(s): PROBNP in the last 8760 hours. HbA1C: No results for input(s): HGBA1C in the last 72 hours. CBG: Recent Labs  Lab 06/01/17 0907  GLUCAP 110*   Lipid Profile: No results for input(s): CHOL, HDL, LDLCALC, TRIG, CHOLHDL, LDLDIRECT in the last 72 hours. Thyroid Function Tests: No results for input(s): TSH, T4TOTAL, FREET4, T3FREE, THYROIDAB in the last 72 hours. Anemia Panel: No results for input(s): VITAMINB12, FOLATE, FERRITIN, TIBC, IRON, RETICCTPCT in the last 72 hours. Urine analysis:    Component Value Date/Time   COLORURINE YELLOW 05/30/2017 1818   APPEARANCEUR CLOUDY (A) 05/30/2017 1818   LABSPEC 1.015 05/30/2017 1818   PHURINE 6.0 05/30/2017 1818   GLUCOSEU NEGATIVE 05/30/2017 1818   HGBUR NEGATIVE 05/30/2017 1818   BILIRUBINUR SMALL (A) 05/30/2017 1818   KETONESUR 15 (A) 05/30/2017 1818   PROTEINUR 30 (A) 05/30/2017 1818   UROBILINOGEN 0.2 07/14/2014 2012   NITRITE NEGATIVE 05/30/2017 1818   LEUKOCYTESUR TRACE (A) 05/30/2017 1818   Sepsis Labs: @LABRCNTIP (procalcitonin:4,lacticidven:4)  ) Recent Results (from the past 240 hour(s))  Culture, blood (routine x 2) Call MD if unable to obtain prior to antibiotics being given     Status: None (Preliminary result)   Collection Time: 05/31/17  7:06 AM  Result Value Ref Range Status   Specimen Description BLOOD RIGHT FOREARM  Final   Special Requests   Final    BOTTLES DRAWN AEROBIC AND ANAEROBIC Blood Culture adequate volume   Culture   Final    NO GROWTH 1 DAY Performed at  [facility], [address], [place], [place] [address]    Report Status PENDING  Incomplete  Culture, blood (routine x 2) Call MD if unable to obtain prior to antibiotics being given     Status: None (Preliminary result)   Collection Time: 05/31/17  7:10 AM  Result Value Ref Range Status   Specimen Description BLOOD LEFT ANTECUBITAL  Final   Special Requests IN PEDIATRIC BOTTLE Blood Culture adequate volume  Final   Culture   Final    NO GROWTH 1 DAY Performed at [facility], [address] [place], [place] [address]    Report Status PENDING  Incomplete  Culture, Urine     Status: Abnormal   Collection Time: 05/31/17  6:42 PM  Result Value Ref Range Status   Specimen Description URINE, CLEAN CATCH  Final   Special Requests NONE  Final   Culture (A)  Final    <10,000 COLONIES/mL INSIGNIFICANT GROWTH Performed at [facility], [address] [place], [place] [address]  Report Status 06/01/2017 FINAL  Final      Studies: No results found.  Scheduled Meds: . aspirin EC  81 mg Oral QODAY  . cloNIDine  0.3 mg Oral BID  . enoxaparin (LOVENOX) injection  40 mg Subcutaneous Q24H  . feeding supplement (ENSURE ENLIVE)  237 mL Oral BID BM  . hydrALAZINE  100 mg Oral TID  . nystatin  5 mL Mouth/Throat QID  . pantoprazole  40 mg Oral Daily  . sodium chloride flush  3 mL Intravenous Q12H    Continuous Infusions: . sodium chloride 1,000 mL (06/01/17 1524)  . azithromycin 500 mg (05/31/17 1738)  . cefTRIAXone (ROCEPHIN)  IV Stopped (06/01/17 1540)     LOS: 2 days     Briant Cedar, MD [facility]   If 7PM-7AM, please contact night-coverage [email or web address] Password [facility] 06/01/2017, 4:41 PM

## 2017-06-02 ENCOUNTER — Inpatient Hospital Stay (HOSPITAL_COMMUNITY): Payer: Medicare Other

## 2017-06-02 LAB — BASIC METABOLIC PANEL
Anion gap: 10 (ref 5–15)
BUN: 22 mg/dL — ABNORMAL HIGH (ref 6–20)
CHLORIDE: 104 mmol/L (ref 101–111)
CO2: 24 mmol/L (ref 22–32)
Calcium: 7.7 mg/dL — ABNORMAL LOW (ref 8.9–10.3)
Creatinine, Ser: 0.49 mg/dL (ref 0.44–1.00)
GFR calc Af Amer: >60 mL/min (ref >60)
Glucose, Bld: 123 mg/dL — ABNORMAL HIGH (ref 65–99)
POTASSIUM: 3.7 mmol/L (ref 3.5–5.1)
SODIUM: 138 mmol/L (ref 135–145)

## 2017-06-02 LAB — CBC WITH DIFFERENTIAL/PLATELET
Basophils Absolute: 0 10*3/uL (ref 0.0–0.1)
Basophils Relative: 0 %
EOS ABS: 0.1 10*3/uL (ref 0.0–0.7)
EOS PCT: 0 %
HCT: 29.5 % — ABNORMAL LOW (ref 36.0–46.0)
HEMOGLOBIN: 9.4 g/dL — AB (ref 12.0–15.0)
LYMPHS ABS: 1.4 10*3/uL (ref 0.7–4.0)
LYMPHS PCT: 7 %
MCH: 28.1 pg (ref 26.0–34.0)
MCHC: 31.9 g/dL (ref 30.0–36.0)
MCV: 88.3 fL (ref 78.0–100.0)
Monocytes Absolute: 0.5 10*3/uL (ref 0.1–1.0)
Monocytes Relative: 2 %
NEUTROS PCT: 91 %
Neutro Abs: 17.3 10*3/uL — ABNORMAL HIGH (ref 1.7–7.7)
PLATELETS: 336 10*3/uL (ref 150–400)
RBC: 3.34 MIL/uL — AB (ref 3.87–5.11)
RDW: 14.5 % (ref 11.5–15.5)
WBC: 19.2 10*3/uL — AB (ref 4.0–10.5)

## 2017-06-02 LAB — GLUCOSE, CAPILLARY: GLUCOSE-CAPILLARY: 125 mg/dL — AB (ref 65–99)

## 2017-06-02 MED ORDER — PIPERACILLIN-TAZOBACTAM 3.375 G IVPB 30 MIN
3.3750 g | Freq: Once | INTRAVENOUS | Status: AC
  Administered 2017-06-02: 3.375 g via INTRAVENOUS
  Filled 2017-06-02: qty 50

## 2017-06-02 MED ORDER — FLUCONAZOLE 100MG IVPB
100.0000 mg | INTRAVENOUS | Status: DC
  Administered 2017-06-02 – 2017-06-03 (×2): 100 mg via INTRAVENOUS
  Filled 2017-06-02 (×3): qty 50

## 2017-06-02 MED ORDER — DEXTROSE-NACL 5-0.45 % IV SOLN
INTRAVENOUS | Status: DC
  Administered 2017-06-02: 1000 mL via INTRAVENOUS

## 2017-06-02 MED ORDER — CLONIDINE HCL 0.3 MG/24HR TD PTWK
0.3000 mg | MEDICATED_PATCH | TRANSDERMAL | Status: DC
  Administered 2017-06-02 – 2017-06-09 (×2): 0.3 mg via TRANSDERMAL
  Filled 2017-06-02 (×2): qty 1

## 2017-06-02 MED ORDER — IPRATROPIUM-ALBUTEROL 0.5-2.5 (3) MG/3ML IN SOLN
3.0000 mL | Freq: Two times a day (BID) | RESPIRATORY_TRACT | Status: DC
  Administered 2017-06-03 (×2): 3 mL via RESPIRATORY_TRACT
  Filled 2017-06-02 (×2): qty 3

## 2017-06-02 MED ORDER — HYDRALAZINE HCL 20 MG/ML IJ SOLN
10.0000 mg | INTRAMUSCULAR | Status: DC | PRN
  Administered 2017-06-02 – 2017-06-09 (×4): 10 mg via INTRAVENOUS
  Filled 2017-06-02 (×5): qty 1

## 2017-06-02 MED ORDER — SODIUM CHLORIDE 0.9 % IV SOLN
1000.0000 mL | INTRAVENOUS | Status: DC
  Administered 2017-06-02: 1000 mL via INTRAVENOUS

## 2017-06-02 MED ORDER — PANTOPRAZOLE SODIUM 40 MG IV SOLR
40.0000 mg | INTRAVENOUS | Status: DC
  Administered 2017-06-02 – 2017-06-06 (×5): 40 mg via INTRAVENOUS
  Filled 2017-06-02 (×5): qty 40

## 2017-06-02 MED ORDER — LABETALOL HCL 5 MG/ML IV SOLN
10.0000 mg | Freq: Once | INTRAVENOUS | Status: AC
  Administered 2017-06-02: 10 mg via INTRAVENOUS
  Filled 2017-06-02: qty 4

## 2017-06-02 MED ORDER — PIPERACILLIN-TAZOBACTAM 3.375 G IVPB
3.3750 g | Freq: Three times a day (TID) | INTRAVENOUS | Status: DC
  Administered 2017-06-02 – 2017-06-07 (×14): 3.375 g via INTRAVENOUS
  Filled 2017-06-02 (×15): qty 50

## 2017-06-02 NOTE — Progress Notes (Signed)
  Speech Language Pathology Treatment: Dysphagia  Patient Details Name: Sierra Giles MRN: [id number] DOB: 12/19/1926 Today's Date: 06/02/2017 Time: 1209-1259 SLP Time Calculation (min) (ACUTE ONLY): 50 min  Assessment / Plan / Recommendation Clinical Impression   ST follow up for patient/family education and swallowing therapy.  Results of MBS were explained in depth to the patient's husband, son and two granddaughters including risks associated with aspiration.  Ice chip protocol was explained as well as demonstrated.  Patient noted to have throat clearing periodically with ice chips x 5.  The patient also performed swallowing exercises 5 times each which included the effortful swallow and the chin tuck against resistance.  The patient/family were instructed to perform these 2 exercises 3x/day at least 5x each as well as 15-20 ice chips per hour with good oral care.  Written instructions were left.  Results were also communicated to the patient's attending MD.  ST will continue to follow for swallowing therapy during acute stay.     HPI HPI: Sierra Giles a 84 y.o.femalewith medical history significant forgeneralized anxiety disorder and hypertension, now presenting to the emergency department for evaluation of cough, shortness of breath, and outpatient chest x-ray suggestive of pneumonia. Patient reports that she had been in her usual state until approximately 2 weeks ago when she noted the insidious development of cough and dyspnea. Symptoms have worsened significantly over the past several days, she saw her PCP yesterday for these complaints, was diagnosed with pneumonia, and started on an antibiotic. She reports a sore throat that has made it difficult to tolerate the oral antibiotic. She reports that rapid strep and influenza PCR were negative at her PCPs clinic. She denies fevers, chills, chest pain, or leg swelling or tenderness. Reports that her cough is nonproductive.  CHest CT  is showing multifocal consolidation in bilateral lungs with adjacent ground glass opacity.        SLP Plan  Continue with current plan of care       Recommendations  Diet recommendations: NPO(x ice chips) Liquids provided via: Teaspoon Medication Administration: Via alternative means                Oral Care Recommendations: Oral care QID Follow up Recommendations: Other (comment)(continued ST at next level of care) SLP Visit Diagnosis: Dysphagia, oropharyngeal phase (R13.12) Plan: Continue with current plan of care       GO               Dimas AguasMelissa Tonya Carlile, MA, CCC-SLP Acute Rehab SLP [phone]  Fleet ContrasMelissa N Sharie Amorin 06/02/2017, 1:03 PM

## 2017-06-02 NOTE — Evaluation (Signed)
Clinical/Bedside Swallow Evaluation Patient Details  Name: Sierra Giles MRN: [id number] Date of Birth: 02/07/1927  Today's Date: 06/02/2017 Time: SLP Start Time (ACUTE ONLY): 0740 SLP Stop Time (ACUTE ONLY): 0801 SLP Time Calculation (min) (ACUTE ONLY): 21 min  Past Medical History:  Past Medical History:  Diagnosis Date  . Anxiety   . GERD (gastroesophageal reflux disease)   . Hypercholesterolemia   . Hypertension   . Hyponatremia   . UTI (lower urinary tract infection)    Past Surgical History: History reviewed. No pertinent surgical history. HPI:  Sierra CongerLamarie Clinardis a 84 y.o.femalewith medical history significant forgeneralized anxiety disorder and hypertension, now presenting to the emergency department for evaluation of cough, shortness of breath, and outpatient chest x-ray suggestive of pneumonia. Patient reports that she had been in her usual state until approximately 2 weeks ago when she noted the insidious development of cough and dyspnea. Symptoms have worsened significantly over the past several days, she saw her PCP yesterday for these complaints, was diagnosed with pneumonia, and started on an antibiotic. She reports a sore throat that has made it difficult to tolerate the oral antibiotic. She reports that rapid strep and influenza PCR were negative at her PCPs clinic. She denies fevers, chills, chest pain, or leg swelling or tenderness. Reports that her cough is nonproductive.  CHest CT is showing multifocal consolidation in bilateral lungs with adjacent ground glass opacity.     Assessment / Plan / Recommendation Clinical Impression  Clinical swallowing evaluation was completed using thin liquids and pureed material.  Oral mechanism exam was completed and remarkable for generalized weakness.  Range of motion of the structures was adequate.  Tjhe patient complains of a severe sore throat for the last few weeks.  Nursing reported that the patient currently has thrush  and is receiving treatment.   The patient presented with an oral and pharyngeal dysphagia.  The oral phase was characterized by delayed oral transit.  The pharyngeal phase was characterized by a possibly delayed swallow trigger and multiple swallows to clear boluses.  Immediate throat clear/cough was seen given spoon sips of thin.  The patient's husband at bedside reported that he has only seen her have issues since the sore throat began.  Recommend that the patient remain NPO pending results of MBS to determine current swallowing physiology.   SLP Visit Diagnosis: Dysphagia, oropharyngeal phase (R13.12)    Aspiration Risk  Moderate aspiration risk    Diet Recommendation   NPO pending results of MBS.         Other  Recommendations Oral Care Recommendations: Oral care BID     Swallow Study   General Date of Onset: 05/30/17 HPI: Sierra CongerLamarie Clinardis a 84 y.o.femalewith medical history significant forgeneralized anxiety disorder and hypertension, now presenting to the emergency department for evaluation of cough, shortness of breath, and outpatient chest x-ray suggestive of pneumonia. Patient reports that she had been in her usual state until approximately 2 weeks ago when she noted the insidious development of cough and dyspnea. Symptoms have worsened significantly over the past several days, she saw her PCP yesterday for these complaints, was diagnosed with pneumonia, and started on an antibiotic. She reports a sore throat that has made it difficult to tolerate the oral antibiotic. She reports that rapid strep and influenza PCR were negative at her PCPs clinic. She denies fevers, chills, chest pain, or leg swelling or tenderness. Reports that her cough is nonproductive.  CHest CT is showing multifocal consolidation in bilateral lungs with adjacent  ground glass opacity.   Type of Study: Bedside Swallow Evaluation Previous Swallow Assessment: None noted at [facility]. Diet Prior to this Study:  Regular;Thin liquids Temperature Spikes Noted: Yes Respiratory Status: Nasal cannula History of Recent Intubation: No Behavior/Cognition: Alert;Cooperative;Requires cueing Oral Cavity Assessment: Dry Oral Care Completed by SLP: No Vision: Functional for self-feeding Self-Feeding Abilities: Needs assist Patient Positioning: Upright in bed Baseline Vocal Quality: Low vocal intensity Volitional Cough: Weak Volitional Swallow: Able to elicit    Oral/Motor/Sensory Function Overall Oral Motor/Sensory Function: Mild impairment Facial ROM: Within Functional Limits Facial Symmetry: Within Functional Limits Facial Strength: Within Functional Limits Lingual ROM: Within Functional Limits Lingual Symmetry: Within Functional Limits Lingual Strength: Reduced Mandible: Within Functional Limits   Ice Chips Ice chips: Not tested   Thin Liquid Thin Liquid: Impaired Presentation: Spoon Oral Phase Impairments: Impaired mastication Oral Phase Functional Implications: Prolonged oral transit Pharyngeal  Phase Impairments: Suspected delayed Swallow;Cough - Immediate;Throat Clearing - Immediate    Nectar Thick Nectar Thick Liquid: Not tested   Honey Thick Honey Thick Liquid: Not tested   Puree Puree: Impaired Presentation: Spoon Oral Phase Impairments: Impaired mastication Oral Phase Functional Implications: Prolonged oral transit Pharyngeal Phase Impairments: Suspected delayed Swallow;Multiple swallows   Solid   GO   Solid: Not tested        Dimas Aguas, MA, CCC-SLP Acute Rehab SLP [phone] Fleet Contras 06/02/2017,8:03 AM

## 2017-06-02 NOTE — Plan of Care (Signed)
Progressing

## 2017-06-02 NOTE — Progress Notes (Signed)
Modified Barium Swallow Progress Note  Patient Details  Name: Sierra Giles MRN: [id number] Date of Birth: 12/11/1926  Today's Date: 06/02/2017  Modified Barium Swallow completed.  Full report located under Chart Review in the Imaging Section.  Brief recommendations include the following:  Clinical Impression  MBS was completed using thin liquids via spoon and pureed material.  Per family there were no obvious issues swallowing until the last few weeks and it was felt to be related to her severe sore throat that is most likely related to thrush.  The patient presented with a moderate oral and severe pharyngeal dysphagia.  The oral phase was marked by decreased bolus control and lingual movement leading to premature spillage of the bolus and oral residue.  The pharyngeal phase was marked by a delayed swallow trigger, decreased hyolaryngeal movement, severely reduced base of tongue retraction and epiglottic movement, decreased laryngeal vestibule closure and decreased pharyngeal contraction.  In addition, cricopharyngeal opening was reduced.  These deficits led to pharyngeal residue in the vallecular, pyriform sinuses and on the posterior pharyngeal wall.  In addition, aspiration with a weak cough response was seen prior to and after the swallow given spoon sips of thin liquids.  Aspiration with a weak cough response was seen during and after the swallow given pureed material.  The patient's spontaneous and volitional cough are not an effective means to clear the material from the airway.  Safest diet recommendation at this time is NPO.  Given that the patient was not symptomatic for swallowing issues until recently am hopeful issues will be short term, however, it could be more of a long term issue.   Recommend allowing the patient to have ice chips with aggressive oral care.  ST will follow up for swallowing therapy and readiness for PO intake.  Suspect she will need repeat MBS as her current medical  condition and stamina improve.     Swallow Evaluation Recommendations       SLP Diet Recommendations: NPO;Ice chips PRN after oral care;Alternative means - temporary       Medication Administration: Via alternative means               Oral Care Recommendations: Oral care QID       Dimas AguasMelissa Porchea Charrier, MA, CCC-SLP Acute Rehab SLP [phone]  Fleet ContrasMelissa N Leelynn Whetsel 06/02/2017,11:53 AM

## 2017-06-02 NOTE — Progress Notes (Addendum)
Pharmacy Antibiotic Note Sierra Giles is a 84 y.o. female admitted on 05/30/2017. Now with concern for aspiration pneumonia and oropharangeal candidiasis .  Pharmacy has been consulted for Zosyn and fluconazole dosing.  Plan: 1. Zosyn 3.375g IV q8h (4 hour infusion).  2. Fluconazole 100 mg IV every 24 hours for 21 days; can switch to PO for remainder of treatment when reliable taking PO.   Height: 5\' 3"  (160 cm) Weight: 106 lb 4.2 oz (48.2 kg) IBW/kg (Calculated) : 52.4  Temp (24hrs), Avg:97.7 F (36.5 C), Min:97.4 F (36.3 C), Max:98.1 F (36.7 C)  Recent Labs  Lab 05/30/17 1642 05/31/17 0421 06/01/17 0153 06/02/17 0217  WBC 32.0* 25.4* 21.6* 19.2*  CREATININE 0.68 0.63 0.55 0.49    Estimated Creatinine Clearance: 37 mL/min (by C-G formula based on SCr of 0.49 mg/dL).    Allergies  Allergen Reactions  . Sulfamethoxazole-Trimethoprim Hives  . Amlodipine Swelling  . Hydrochlorothiazide Other (See Comments)    Hyponatremia   . Nitrofuran Derivatives Other (See Comments)    Unknown reaction  . Zolpidem Nausea And Vomiting and Other (See Comments)    Confusion     Thank you for allowing pharmacy to be a part of this patient's care.  Sheron NightingaleJames A Xochil Shanker 06/02/2017 1:02 PM

## 2017-06-02 NOTE — Progress Notes (Signed)
PROGRESS NOTE  Sierra Giles ZOX:[id number] DOB: 1926/08/03 DOA: 05/30/2017 PCP: Cheron Schaumann., MD  HPI/Recap of past 24 hours: Sierra Giles is a 84 y.o. female with medical history significant for generalized anxiety disorder and hypertension, now presenting to the emergency department for evaluation of cough, SOB and outpatient chest x-ray suggestive of pneumonia with ongoing symptoms of cough, dyspnea and odynophagia for 2 weeks which progressively got worse. Pt was unable to tolerate her outpt med prescribed due to odynophagia. A rapid strep and influenza PCR were negative at her PCPs clinic. In the ED, patient was saturating 88% on room air, tachypneic, and hypertensive. CT chest was performed and reveals a multifocal consolidation bilaterally suspicious for multifocal pneumonia. Pt was admitted for further management.  Today, pt reported feeling the same, denies worsening. Still c/o odynophagia, with very little improvement. SLP consulted, noted severe dysphagia. Placed pt NPO. Denies chest pain, worsening SOB, fever/chills.   Assessment/Plan: Principal Problem:   CAP (community acquired pneumonia) Active Problems:   Acute respiratory failure with hypoxia (HCC)   Normocytic anemia   Hypokalemia   Pancreatic lesion   Anxiety   Hypertension  Sepsis due to multifocal CAP Afebrile, resolving leukocytosis CT showing multifocal pneumonia BC, NGTD, procalcitonin trending downwards, influenza neg Started IV Zosyn to cover aspiration pna, continue IV Azithromycin  Acute hypoxic resp failure due to multifocal CAP Still requiring O2, intermittently tachypneic CT as above O2, duoneb SLP performed MBS with severe aspiration risk, rec NPO, will follow   Dysphagia/Odynophagia ??oral thrush noted on exam Started on nystatin suspension, switched to IV fluconazole as pt is NPO, continue magic mouthwash/lidocaine, chloraseptic mouth spray Severe risk of aspiration, NPO,  aspiration precautions Place feeding tube in am, nutrition consulted  Hypertension Stable Continue hydralazine and clonidine patch   Hypokalemia  Resolved Daily BMET   Anxiety  Continue Lexapro and low-dose Xanax    Incidental pancreatic lesion Follow-up with outpatient MRI recommended       Code Status: Full  Family Communication: Discussed with son who is a cardiologist, husband, granddaughters  Disposition Plan: To be determined   Consultants:  None  Procedures:  None  Antimicrobials:  IV Zosyn  IV azithromycin  DVT prophylaxis: Lovenox   Objective: Vitals:   06/02/17 0642 06/02/17 0702 06/02/17 0814 06/02/17 1448  BP: (!) 180/73 (!) 182/67    Pulse: 80     Resp: 17     Temp: (!) 97.4 F (36.3 C)     TempSrc: Oral     SpO2: 97%  93% 98%  Weight:      Height:        Intake/Output Summary (Last 24 hours) at 06/02/2017 1751 Last data filed at 06/02/2017 1722 Gross per 24 hour  Intake 1100 ml  Output 850 ml  Net 250 ml   Filed Weights   05/31/17 0037 05/31/17 0447 06/01/17 0500  Weight: 47.3 kg (104 lb 4.4 oz) 47.3 kg (104 lb 4.4 oz) 48.2 kg (106 lb 4.2 oz)    Exam:   General: Alert, awake, appears frail, ill-appearing anxious  Cardiovascular: S1, S2 present, no added heart sound  Respiratory: Rhonchi noted bilaterally  Abdomen: Soft, nondistended, nontender, bowel sounds present  Musculoskeletal: No bilateral pedal edema  Skin: Normal  Psychiatry: Mildly anxious   Data Reviewed: CBC: Recent Labs  Lab 05/30/17 1642 05/31/17 0421 06/01/17 0153 06/02/17 0217  WBC 32.0* 25.4* 21.6* 19.2*  NEUTROABS 30.1* 23.9* 20.1* 17.3*  HGB 11.2* 9.6* 9.2* 9.4*  HCT 33.6*  29.7* 29.1* 29.5*  MCV 86.2 86.1 87.4 88.3  PLT 376 310 324 336   Basic Metabolic Panel: Recent Labs  Lab 05/30/17 1642 05/31/17 0421 06/01/17 0153 06/02/17 0217  NA 138 138 138 138  K 3.2* 3.0* 3.5 3.7  CL 100* 102 103 104  CO2 25 23 24 24   GLUCOSE  102* 95 112* 123*  BUN 31* 21* 20 22*  CREATININE 0.68 0.63 0.55 0.49  CALCIUM 8.6* 7.9* 7.8* 7.7*  MG  --  2.5*  --   --    GFR: Estimated Creatinine Clearance: 37 mL/min (by C-G formula based on SCr of 0.49 mg/dL). Liver Function Tests: Recent Labs  Lab 05/30/17 1642  AST 26  ALT 18  ALKPHOS 114  BILITOT 0.9  PROT 7.5  ALBUMIN 2.7*   No results for input(s): LIPASE, AMYLASE in the last 168 hours. No results for input(s): AMMONIA in the last 168 hours. Coagulation Profile: No results for input(s): INR, PROTIME in the last 168 hours. Cardiac Enzymes: No results for input(s): CKTOTAL, CKMB, CKMBINDEX, TROPONINI in the last 168 hours. BNP (last 3 results) No results for input(s): PROBNP in the last 8760 hours. HbA1C: No results for input(s): HGBA1C in the last 72 hours. CBG: Recent Labs  Lab 06/01/17 0907 06/02/17 0800  GLUCAP 110* 125*   Lipid Profile: No results for input(s): CHOL, HDL, LDLCALC, TRIG, CHOLHDL, LDLDIRECT in the last 72 hours. Thyroid Function Tests: No results for input(s): TSH, T4TOTAL, FREET4, T3FREE, THYROIDAB in the last 72 hours. Anemia Panel: No results for input(s): VITAMINB12, FOLATE, FERRITIN, TIBC, IRON, RETICCTPCT in the last 72 hours. Urine analysis:    Component Value Date/Time   COLORURINE YELLOW 05/30/2017 1818   APPEARANCEUR CLOUDY (A) 05/30/2017 1818   LABSPEC 1.015 05/30/2017 1818   PHURINE 6.0 05/30/2017 1818   GLUCOSEU NEGATIVE 05/30/2017 1818   HGBUR NEGATIVE 05/30/2017 1818   BILIRUBINUR SMALL (A) 05/30/2017 1818   KETONESUR 15 (A) 05/30/2017 1818   PROTEINUR 30 (A) 05/30/2017 1818   UROBILINOGEN 0.2 07/14/2014 2012   NITRITE NEGATIVE 05/30/2017 1818   LEUKOCYTESUR TRACE (A) 05/30/2017 1818   Sepsis Labs: @LABRCNTIP (procalcitonin:4,lacticidven:4)  ) Recent Results (from the past 240 hour(s))  Culture, blood (routine x 2) Call MD if unable to obtain prior to antibiotics being given     Status: None (Preliminary  result)   Collection Time: 05/31/17  7:06 AM  Result Value Ref Range Status   Specimen Description BLOOD RIGHT FOREARM  Final   Special Requests   Final    BOTTLES DRAWN AEROBIC AND ANAEROBIC Blood Culture adequate volume   Culture   Final    NO GROWTH 2 DAYS Performed at [facility], [address] [place], [place] [address]    Report Status PENDING  Incomplete  Culture, blood (routine x 2) Call MD if unable to obtain prior to antibiotics being given     Status: None (Preliminary result)   Collection Time: 05/31/17  7:10 AM  Result Value Ref Range Status   Specimen Description BLOOD LEFT ANTECUBITAL  Final   Special Requests IN PEDIATRIC BOTTLE Blood Culture adequate volume  Final   Culture   Final    NO GROWTH 2 DAYS Performed at [facility], [address] [place], [place] [address]    Report Status PENDING  Incomplete  Culture, Urine     Status: Abnormal   Collection Time: 05/31/17  6:42 PM  Result Value Ref Range Status  Specimen Description URINE, CLEAN CATCH  Final   Special Requests NONE  Final   Culture (A)  Final    <10,000 COLONIES/mL INSIGNIFICANT GROWTH Performed at [facility], [address] [place], [place] [address]    Report Status 06/01/2017 FINAL  Final      Studies: Dg Swallowing Func-speech Pathology  Result Date: 06/02/2017 Objective Swallowing Evaluation: Type of Study: MBS-Modified Barium Swallow Study  Patient Details Name: Sierra Giles MRN: [id number] Date of Birth: 1926/12/24 Today's Date: 06/02/2017 Time: SLP Start Time (ACUTE ONLY): 1044 -SLP Stop Time (ACUTE ONLY): 1115 SLP Time Calculation (min) (ACUTE ONLY): 31 min Past Medical History: Past Medical History: Diagnosis Date . Anxiety  . GERD (gastroesophageal reflux disease)  . Hypercholesterolemia  . Hypertension  . Hyponatremia  . UTI (lower urinary tract infection)  Past Surgical History: No past surgical history on file. HPI: Tylesha Clinardis a 84 y.o.femalewith  medical history significant forgeneralized anxiety disorder and hypertension, now presenting to the emergency department for evaluation of cough, shortness of breath, and outpatient chest x-ray suggestive of pneumonia. Patient reports that she had been in her usual state until approximately 2 weeks ago when she noted the insidious development of cough and dyspnea. Symptoms have worsened significantly over the past several days, she saw her PCP yesterday for these complaints, was diagnosed with pneumonia, and started on an antibiotic. She reports a sore throat that has made it difficult to tolerate the oral antibiotic. She reports that rapid strep and influenza PCR were negative at her PCPs clinic. She denies fevers, chills, chest pain, or leg swelling or tenderness. Reports that her cough is nonproductive.  CHest CT is showing multifocal consolidation in bilateral lungs with adjacent ground glass opacity.   Subjective: The patient was seen in radiology for MBS to determine current swallowing physiology.  Assessment / Plan / Recommendation CHL IP CLINICAL IMPRESSIONS 06/02/2017 Clinical Impression MBS was completed using thin liquids via spoon and pureed material.  Per family there were no obvious issues swallowing until the last few weeks and it was felt to be related to her severe sore throat that is most likely related to thrush.  The patient presented with a moderate oral and severe pharyngeal dysphagia.  The oral phase was marked by decreased bolus control and lingual movement leading to premature spillage of the bolus and oral residue.  The pharyngeal phase was marked by a delayed swallow trigger, decreased hyolaryngeal movement, severely reduced base of tongue retraction and epiglottic movement, decreased laryngeal vestibule closure and decreased pharyngeal contraction.  In addition, cricopharyngeal opening was reduced.  These deficits led to pharyngeal residue in the vallecular, pyriform sinuses and on  the posterior pharyngeal wall.  In addition, aspiration with a weak cough response was seen prior to and after the swallow given spoon sips of thin liquids.  Aspiration with a weak cough response was seen during and after the swallow given pureed material.  The patient's spontaneous and volitional cough are not an effective means to clear the material from the airway.  Safest diet recommendation at this time is NPO.  Given that the patient was not symptomatic for swallowing issues until recently am hopeful issues will be short term, however, it could be more of a long term issue.   Recommend allowing the patient to have ice chips with aggressive oral care.  ST will follow up for swallowing therapy and readiness for PO intake.  Suspect she will need repeat MBS as her current medical condition  and stamina improve.   SLP Visit Diagnosis Dysphagia, oropharyngeal phase (R13.12) Attention and concentration deficit following -- Frontal lobe and executive function deficit following -- Impact on safety and function Severe aspiration risk   CHL IP TREATMENT RECOMMENDATION 06/02/2017 Treatment Recommendations Therapy as outlined in treatment plan below   Prognosis 06/02/2017 Prognosis for Safe Diet Advancement Fair Barriers to Reach Goals -- Barriers/Prognosis Comment -- CHL IP DIET RECOMMENDATION 06/02/2017 SLP Diet Recommendations NPO;Ice chips PRN after oral care;Alternative means - temporary Liquid Administration via -- Medication Administration Via alternative means Compensations -- Postural Changes --   CHL IP OTHER RECOMMENDATIONS 06/02/2017 Recommended Consults -- Oral Care Recommendations Oral care QID Other Recommendations --   CHL IP FOLLOW UP RECOMMENDATIONS 06/02/2017 Follow up Recommendations Other (comment)   CHL IP FREQUENCY AND DURATION 06/02/2017 Speech Therapy Frequency (ACUTE ONLY) min 2x/week Treatment Duration 2 weeks      CHL IP ORAL PHASE 06/02/2017 Oral Phase Impaired Oral - Pudding Teaspoon -- Oral - Pudding  Cup -- Oral - Honey Teaspoon -- Oral - Honey Cup -- Oral - Nectar Teaspoon -- Oral - Nectar Cup -- Oral - Nectar Straw -- Oral - Thin Teaspoon Weak lingual manipulation;Reduced posterior propulsion;Premature spillage;Decreased bolus cohesion;Delayed oral transit Oral - Thin Cup -- Oral - Thin Straw -- Oral - Puree Weak lingual manipulation;Reduced posterior propulsion;Delayed oral transit;Decreased bolus cohesion;Premature spillage Oral - Mech Soft -- Oral - Regular -- Oral - Multi-Consistency -- Oral - Pill -- Oral Phase - Comment --  CHL IP PHARYNGEAL PHASE 06/02/2017 Pharyngeal Phase Impaired Pharyngeal- Pudding Teaspoon -- Pharyngeal -- Pharyngeal- Pudding Cup -- Pharyngeal -- Pharyngeal- Honey Teaspoon -- Pharyngeal -- Pharyngeal- Honey Cup -- Pharyngeal -- Pharyngeal- Nectar Teaspoon -- Pharyngeal -- Pharyngeal- Nectar Cup -- Pharyngeal -- Pharyngeal- Nectar Straw -- Pharyngeal -- Pharyngeal- Thin Teaspoon Delayed swallow initiation-pyriform sinuses;Reduced pharyngeal peristalsis;Reduced epiglottic inversion;Reduced anterior laryngeal mobility;Reduced laryngeal elevation;Reduced airway/laryngeal closure;Reduced tongue base retraction;Penetration/Aspiration before swallow;Penetration/Apiration after swallow;Moderate aspiration;Pharyngeal residue - valleculae;Pharyngeal residue - pyriform Pharyngeal Material enters airway, passes BELOW cords and not ejected out despite cough attempt by patient Pharyngeal- Thin Cup -- Pharyngeal -- Pharyngeal- Thin Straw -- Pharyngeal -- Pharyngeal- Puree Delayed swallow initiation-vallecula;Reduced pharyngeal peristalsis;Reduced epiglottic inversion;Reduced anterior laryngeal mobility;Reduced laryngeal elevation;Reduced airway/laryngeal closure;Reduced tongue base retraction;Penetration/Aspiration during swallow;Penetration/Apiration after swallow;Moderate aspiration;Pharyngeal residue - valleculae;Pharyngeal residue - pyriform Pharyngeal Material enters airway, passes BELOW  cords and not ejected out despite cough attempt by patient Pharyngeal- Mechanical Soft -- Pharyngeal -- Pharyngeal- Regular -- Pharyngeal -- Pharyngeal- Multi-consistency -- Pharyngeal -- Pharyngeal- Pill -- Pharyngeal -- Pharyngeal Comment --  CHL IP CERVICAL ESOPHAGEAL PHASE 06/02/2017 Cervical Esophageal Phase Impaired Pudding Teaspoon -- Pudding Cup -- Honey Teaspoon -- Honey Cup -- Nectar Teaspoon -- Nectar Cup -- Nectar Straw -- Thin Teaspoon Reduced cricopharyngeal relaxation Thin Cup -- Thin Straw -- Puree Reduced cricopharyngeal relaxation Mechanical Soft -- Regular -- Multi-consistency -- Pill -- Cervical Esophageal Comment -- Dimas AguasMelissa Goodman, MA, CCC-SLP Acute Rehab SLP [phone]' Fleet ContrasMelissa N Goodman 06/02/2017, 11:54 AM               Scheduled Meds: . aspirin EC  81 mg Oral QODAY  . cloNIDine  0.3 mg Transdermal Weekly  . enoxaparin (LOVENOX) injection  40 mg Subcutaneous Q24H  . feeding supplement (ENSURE ENLIVE)  237 mL Oral BID BM  . ipratropium-albuterol  3 mL Nebulization Q6H  . pantoprazole (PROTONIX) IV  40 mg Intravenous Q24H  . sodium chloride flush  3 mL Intravenous Q12H    Continuous  Infusions: . azithromycin Stopped (06/02/17 1822)  . dextrose 5 % and 0.45% NaCl 1,000 mL (06/02/17 1454)  . fluconazole (DIFLUCAN) IV Stopped (06/02/17 1555)  . piperacillin-tazobactam (ZOSYN)  IV       LOS: 3 days     Briant Cedar, MD [facility]   If 7PM-7AM, please contact night-coverage [email or web address] Password [facility] 06/02/2017, 5:51 PM

## 2017-06-03 ENCOUNTER — Inpatient Hospital Stay (HOSPITAL_COMMUNITY): Payer: Medicare Other

## 2017-06-03 DIAGNOSIS — E43 Unspecified severe protein-calorie malnutrition: Secondary | ICD-10-CM

## 2017-06-03 LAB — CBC WITH DIFFERENTIAL/PLATELET
BASOS PCT: 0 %
Basophils Absolute: 0 10*3/uL (ref 0.0–0.1)
EOS ABS: 0.1 10*3/uL (ref 0.0–0.7)
EOS PCT: 0 %
HCT: 29.6 % — ABNORMAL LOW (ref 36.0–46.0)
Hemoglobin: 9.5 g/dL — ABNORMAL LOW (ref 12.0–15.0)
Lymphocytes Relative: 6 %
Lymphs Abs: 1 10*3/uL (ref 0.7–4.0)
MCH: 28.1 pg (ref 26.0–34.0)
MCHC: 32.1 g/dL (ref 30.0–36.0)
MCV: 87.6 fL (ref 78.0–100.0)
MONO ABS: 0.5 10*3/uL (ref 0.1–1.0)
MONOS PCT: 3 %
NEUTROS PCT: 91 %
Neutro Abs: 16 10*3/uL — ABNORMAL HIGH (ref 1.7–7.7)
Platelets: 342 10*3/uL (ref 150–400)
RBC: 3.38 MIL/uL — ABNORMAL LOW (ref 3.87–5.11)
RDW: 14.3 % (ref 11.5–15.5)
WBC: 17.6 10*3/uL — ABNORMAL HIGH (ref 4.0–10.5)

## 2017-06-03 LAB — GLUCOSE, CAPILLARY
Glucose-Capillary: 129 mg/dL — ABNORMAL HIGH (ref 65–99)
Glucose-Capillary: 108 mg/dL — ABNORMAL HIGH (ref 65–99)
Glucose-Capillary: 102 mg/dL — ABNORMAL HIGH (ref 65–99)

## 2017-06-03 LAB — BASIC METABOLIC PANEL
Anion gap: 11 (ref 5–15)
BUN: 13 mg/dL (ref 6–20)
CALCIUM: 7.7 mg/dL — AB (ref 8.9–10.3)
CO2: 25 mmol/L (ref 22–32)
CREATININE: 0.55 mg/dL (ref 0.44–1.00)
Chloride: 103 mmol/L (ref 101–111)
GFR calc non Af Amer: >60 mL/min (ref >60)
Glucose, Bld: 115 mg/dL — ABNORMAL HIGH (ref 65–99)
Potassium: 3.5 mmol/L (ref 3.5–5.1)
SODIUM: 139 mmol/L (ref 135–145)

## 2017-06-03 MED ORDER — SODIUM CHLORIDE 0.9 % IV SOLN
500.0000 mg | INTRAVENOUS | Status: DC
  Administered 2017-06-03: 500 mg via INTRAVENOUS
  Filled 2017-06-03: qty 500

## 2017-06-03 MED ORDER — JEVITY 1.2 CAL PO LIQD
1000.0000 mL | ORAL | Status: DC
  Administered 2017-06-03 – 2017-06-06 (×3): 1000 mL
  Filled 2017-06-03 (×6): qty 1000

## 2017-06-03 MED ORDER — HYDRALAZINE HCL 50 MG PO TABS
100.0000 mg | ORAL_TABLET | Freq: Three times a day (TID) | ORAL | Status: DC
  Administered 2017-06-03 – 2017-06-10 (×22): 100 mg via ORAL
  Filled 2017-06-03 (×23): qty 2

## 2017-06-03 MED ORDER — IPRATROPIUM-ALBUTEROL 0.5-2.5 (3) MG/3ML IN SOLN: 3.0000 mL | RESPIRATORY_TRACT | Status: DC | PRN

## 2017-06-03 NOTE — Progress Notes (Signed)
PROGRESS NOTE  Sierra Giles ZOX:[id number] DOB: [birth year] DOA: 05/30/2017 PCP: Cheron Schaumann., MD  HPI/Recap of past 24 hours: Sierra Giles is a 84 y.o. female with medical history significant for generalized anxiety disorder and hypertension, now presenting to the emergency department for evaluation of cough, SOB and outpatient chest x-ray suggestive of pneumonia with ongoing symptoms of cough, dyspnea and odynophagia for 2 weeks which progressively got worse. Pt was unable to tolerate her outpt med prescribed due to odynophagia. A rapid strep and influenza PCR were negative at her PCPs clinic. In the ED, patient was saturating 88% on room air, tachypneic, and hypertensive. CT chest was performed and reveals a multifocal consolidation bilaterally suspicious for multifocal pneumonia. Pt was admitted for further management.  Today, pt reported feeling the same, denies worsening. Still c/o of odynophagia. Denies chest pain, worsening SOB, fever/chills. Cotrak placed today as pt is malnourished.   Assessment/Plan: Principal Problem:   CAP (community acquired pneumonia) Active Problems:   Acute respiratory failure with hypoxia (HCC)   Normocytic anemia   Hypokalemia   Pancreatic lesion   Anxiety   Hypertension   Protein-calorie malnutrition, severe  Sepsis due to multifocal CAP Afebrile, resolving leukocytosis CT showing multifocal pneumonia, repeat CXR persistent multi-focal PNA BC, NGTD, procalcitonin trending downwards, influenza neg Started IV Zosyn to cover aspiration pna, continue IV Azithromycin  Acute hypoxic resp failure due to multifocal CAP Still requiring O2, intermittently tachypneic CT as above O2, duoneb SLP performed MBS with severe aspiration risk, rec NPO, will follow   Dysphagia/Odynophagia ??oral thrush noted on exam Started on nystatin suspension, switched to IV fluconazole as pt is NPO, continue magic mouthwash/lidocaine, chloraseptic mouth  spray Severe risk of aspiration, NPO, aspiration precautions Cotrak feeding tube inserted on 06/03/17, nutrition on board  Hypertension Stable Continue hydralazine and clonidine patch   Hypokalemia  Resolved Daily BMET   Anxiety  Continue Lexapro and low-dose Xanax    Hx of pancreatic lesion CT Chest showed upper abdomen with cystic pancreatic lesion  Follow-up with outpatient MRI recommended       Code Status: Full  Family Communication: Discussed with son who is a cardiologist, husband  Disposition Plan: To be determined   Consultants:  None  Procedures:  Cotrak placement on 06/03/17  Antimicrobials:  IV Zosyn  IV azithromycin  DVT prophylaxis: Lovenox   Objective: Vitals:   06/03/17 0600 06/03/17 0950 06/03/17 1453 06/03/17 1620  BP:   (!) 209/82 (!) 194/66  Pulse:  72 81 79  Resp:  16 20   Temp:   98.8 F (37.1 C)   TempSrc:   Oral   SpO2:  96% 92%   Weight: 46.3 kg (102 lb 1.2 oz)     Height:        Intake/Output Summary (Last 24 hours) at 06/03/2017 1726 Last data filed at 06/03/2017 1452 Gross per 24 hour  Intake 1222.5 ml  Output 700 ml  Net 522.5 ml   Filed Weights   05/31/17 0447 06/01/17 0500 06/03/17 0600  Weight: 47.3 kg (104 lb 4.4 oz) 48.2 kg (106 lb 4.2 oz) 46.3 kg (102 lb 1.2 oz)    Exam:   General: Alert, awake, appears frail, ill-appearing anxious  Cardiovascular: S1, S2 present, no added heart sound  Respiratory: Rhonchi noted bilaterally  Abdomen: Soft, nondistended, nontender, bowel sounds present  Musculoskeletal: No bilateral pedal edema  Skin: Normal  Psychiatry: Mildly anxious   Data Reviewed: CBC: Recent Labs  Lab 05/30/17 1642 05/31/17 0421  06/01/17 0153 06/02/17 0217 06/03/17 0448  WBC 32.0* 25.4* 21.6* 19.2* 17.6*  NEUTROABS 30.1* 23.9* 20.1* 17.3* 16.0*  HGB 11.2* 9.6* 9.2* 9.4* 9.5*  HCT 33.6* 29.7* 29.1* 29.5* 29.6*  MCV 86.2 86.1 87.4 88.3 87.6  PLT 376 310 324 336 342   Basic  Metabolic Panel: Recent Labs  Lab 05/30/17 1642 05/31/17 0421 06/01/17 0153 06/02/17 0217 06/03/17 0448  NA 138 138 138 138 139  K 3.2* 3.0* 3.5 3.7 3.5  CL 100* 102 103 104 103  CO2 25 23 24 24 25   GLUCOSE 102* 95 112* 123* 115*  BUN 31* 21* 20 22* 13  CREATININE 0.68 0.63 0.55 0.49 0.55  CALCIUM 8.6* 7.9* 7.8* 7.7* 7.7*  MG  --  2.5*  --   --   --    GFR: Estimated Creatinine Clearance: 35.5 mL/min (by C-G formula based on SCr of 0.55 mg/dL). Liver Function Tests: Recent Labs  Lab 05/30/17 1642  AST 26  ALT 18  ALKPHOS 114  BILITOT 0.9  PROT 7.5  ALBUMIN 2.7*   No results for input(s): LIPASE, AMYLASE in the last 168 hours. No results for input(s): AMMONIA in the last 168 hours. Coagulation Profile: No results for input(s): INR, PROTIME in the last 168 hours. Cardiac Enzymes: No results for input(s): CKTOTAL, CKMB, CKMBINDEX, TROPONINI in the last 168 hours. BNP (last 3 results) No results for input(s): PROBNP in the last 8760 hours. HbA1C: No results for input(s): HGBA1C in the last 72 hours. CBG: Recent Labs  Lab 06/01/17 0907 06/02/17 0800 06/03/17 0753 06/03/17 1627  GLUCAP 110* 125* 129* 108*   Lipid Profile: No results for input(s): CHOL, HDL, LDLCALC, TRIG, CHOLHDL, LDLDIRECT in the last 72 hours. Thyroid Function Tests: No results for input(s): TSH, T4TOTAL, FREET4, T3FREE, THYROIDAB in the last 72 hours. Anemia Panel: No results for input(s): VITAMINB12, FOLATE, FERRITIN, TIBC, IRON, RETICCTPCT in the last 72 hours. Urine analysis:    Component Value Date/Time   COLORURINE YELLOW 05/30/2017 1818   APPEARANCEUR CLOUDY (A) 05/30/2017 1818   LABSPEC 1.015 05/30/2017 1818   PHURINE 6.0 05/30/2017 1818   GLUCOSEU NEGATIVE 05/30/2017 1818   HGBUR NEGATIVE 05/30/2017 1818   BILIRUBINUR SMALL (A) 05/30/2017 1818   KETONESUR 15 (A) 05/30/2017 1818   PROTEINUR 30 (A) 05/30/2017 1818   UROBILINOGEN 0.2 07/14/2014 2012   NITRITE NEGATIVE 05/30/2017  1818   LEUKOCYTESUR TRACE (A) 05/30/2017 1818   Sepsis Labs: @LABRCNTIP (procalcitonin:4,lacticidven:4)  ) Recent Results (from the past 240 hour(s))  Culture, blood (routine x 2) Call MD if unable to obtain prior to antibiotics being given     Status: None (Preliminary result)   Collection Time: 05/31/17  7:06 AM  Result Value Ref Range Status   Specimen Description BLOOD RIGHT FOREARM  Final   Special Requests   Final    BOTTLES DRAWN AEROBIC AND ANAEROBIC Blood Culture adequate volume   Culture   Final    NO GROWTH 3 DAYS Performed at [facility], [address], [place], [place] [address]    Report Status PENDING  Incomplete  Culture, blood (routine x 2) Call MD if unable to obtain prior to antibiotics being given     Status: None (Preliminary result)   Collection Time: 05/31/17  7:10 AM  Result Value Ref Range Status   Specimen Description BLOOD LEFT ANTECUBITAL  Final   Special Requests IN PEDIATRIC BOTTLE Blood Culture adequate volume  Final   Culture   Final  NO GROWTH 3 DAYS Performed at [facility], [address] [place], [place] [address]    Report Status PENDING  Incomplete  Culture, Urine     Status: Abnormal   Collection Time: 05/31/17  6:42 PM  Result Value Ref Range Status   Specimen Description URINE, CLEAN CATCH  Final   Special Requests NONE  Final   Culture (A)  Final    <10,000 COLONIES/mL INSIGNIFICANT GROWTH Performed at [facility], [address] [place], [place] [address]    Report Status 06/01/2017 FINAL  Final      Studies: Dg Chest Port 1 View  Result Date: 06/03/2017 CLINICAL DATA:  Sore throat with weakness and dyspnea.  Pneumonia. EXAM: PORTABLE CHEST 1 VIEW COMPARISON:  Radiographs and CT 05/30/2017. FINDINGS: 0631 hr. The heart size and mediastinal contours are stable. There is aortic atherosclerosis. Multifocal airspace opacities bilaterally have not significantly changed. There is no pneumothorax or significant  pleural effusion. No acute osseous findings are seen. Evidence of a chronic rotator cuff tear on the right noted. IMPRESSION: No significant change in multifocal airspace opacities consistent with pneumonia. Electronically Signed   By: Carey BullocksWilliam  Veazey M.D.   On: 06/03/2017 07:39    Scheduled Meds: . aspirin EC  81 mg Oral QODAY  . cloNIDine  0.3 mg Transdermal Weekly  . enoxaparin (LOVENOX) injection  40 mg Subcutaneous Q24H  . feeding supplement (ENSURE ENLIVE)  237 mL Oral BID BM  . hydrALAZINE  100 mg Oral Q8H  . ipratropium-albuterol  3 mL Nebulization BID  . pantoprazole (PROTONIX) IV  40 mg Intravenous Q24H  . sodium chloride flush  3 mL Intravenous Q12H    Continuous Infusions: . azithromycin 500 mg (06/03/17 1656)  . feeding supplement (JEVITY 1.2 CAL) 1,000 mL (06/03/17 1538)  . fluconazole (DIFLUCAN) IV Stopped (06/03/17 1411)  . piperacillin-tazobactam (ZOSYN)  IV 3.375 g (06/03/17 1310)     LOS: 4 days     Briant CedarNkeiruka J Ezenduka, MD [facility]   If 7PM-7AM, please contact night-coverage [email or web address] Password [email or web address] 06/03/2017, 5:26 PM

## 2017-06-03 NOTE — Progress Notes (Signed)
BP 209/82, 10 mg IV Hydralazine given. BP decreased to 194/66. MD notified and received order to give 100 mg Hydralazine via tube. BP decreased to 170/73. MD aware.

## 2017-06-03 NOTE — Progress Notes (Signed)
Cortrak Tube Team Note:  Consult received to place a Cortrak feeding tube.  Discussed placement with MD. Also spoke with pt's son who is a physician as well as husband and two additional sons. Family decided to proceed with placement.  A 10 F Cortrak tube was placed in the R nare and secured with a nasal bridle at 78 cm. Per the Cortrak monitor reading the tube tip is gastric.   No x-ray is required. RN may begin using tube.   If the tube becomes dislodged please keep the tube and contact the Cortrak team at [email or web address] (password [email or web address]) for replacement.  If after hours and replacement cannot be delayed, place a NG tube and confirm placement with an abdominal x-ray.    Earma ReadingKate Jablonski [name], MS, RD, LDN Pager: [phone] Weekend/After Hours: [phone]

## 2017-06-03 NOTE — Progress Notes (Signed)
  Speech Language Pathology Treatment: Dysphagia  Patient Details Name: Sierra Giles MRN: [id number] DOB: 03/21/1927 Today's Date: 06/03/2017 Time: 1027-1100 SLP Time Calculation (min) (ACUTE ONLY): 33 min  Assessment / Plan / Recommendation Clinical Impression  Pt performed swallowing exercises (effortful swallow x10, CTAR x5) with Min cues for completion and additional time for rest breaks. Few ice chips were provided during exercises which elicited weak coughing, concerning for decreased airway protection. Min cues were also provided for more effortful cough. SLP provided education and training to pt as well as her spouse and two of her sons about current recommendations for exercise regimen and reviewed overall current level of function. They had questions about alternative means of nutrition - SLP answered as able and then RD arrived to answer additional questions. SLP will continue to follow.   HPI HPI: Sierra CongerLamarie Clinardis a 84 y.o.femalewith medical history significant forgeneralized anxiety disorder and hypertension, now presenting to the emergency department for evaluation of cough, shortness of breath, and outpatient chest x-ray suggestive of pneumonia. Patient reports that she had been in her usual state until approximately 2 weeks ago when she noted the insidious development of cough and dyspnea. Symptoms have worsened significantly over the past several days, she saw her PCP yesterday for these complaints, was diagnosed with pneumonia, and started on an antibiotic. She reports a sore throat that has made it difficult to tolerate the oral antibiotic. She reports that rapid strep and influenza PCR were negative at her PCPs clinic. She denies fevers, chills, chest pain, or leg swelling or tenderness. Reports that her cough is nonproductive.  CHest CT is showing multifocal consolidation in bilateral lungs with adjacent ground glass opacity.        SLP Plan  Continue with current  plan of care       Recommendations  Diet recommendations: NPO(allow few ice chips after oral care) Medication Administration: Via alternative means                Oral Care Recommendations: Oral care QID;Oral care prior to ice chip/H20 Follow up Recommendations: (SLP f/u at next level of care) SLP Visit Diagnosis: Dysphagia, oropharyngeal phase (R13.12) Plan: Continue with current plan of care       GO                Sierra Giles, Sierra Giles 06/03/2017, 12:33 PM  Sierra Giles, M.A. CCC-SLP [phone]

## 2017-06-03 NOTE — Progress Notes (Addendum)
Nutrition Follow-up  DOCUMENTATION CODES:   Severe malnutrition in context of chronic illness, Underweight  INTERVENTION:  Begin Jevity 1.2 at 3715mL/hr increase by 10 every 24 hours to goal rate of 4745mL/hr  Provides 1296 calories, 60gm protein, 872mL free water  Recommend additional free water of 160mL Q6H if patient is off IV fluids.  Recommend MD monitor K+, Phos, Mg for at least 3 days, replete as necessary, patient is at high risk for refeeding  NUTRITION DIAGNOSIS:   Severe Malnutrition related to chronic illness as evidenced by severe fat depletion, severe muscle depletion. -ongoing  GOAL:   Patient will meet greater than or equal to 90% of their needs -unmet  MONITOR:   PO intake, Supplement acceptance, Labs, Weight trends, I & O's  REASON FOR ASSESSMENT:   Malnutrition Screening Tool    ASSESSMENT:   84 yo female admitted 2/7 with acute hypoxic respiratory failure secondary to pneumonia, hypokalemia, incidental pancreatic lesion. PMH SIADH, COPD, generalized anxiety disorder, HTN, GERD.  SLP performed MBS, severe aspiration risk and dysphagia, Performing swallowing exercises Cortrak tube placed 2/11 Monitor tolerance  Labs reviewed Medications reviewed and include:  D5 NS @ 5175mL/hr --> 306 calories   Diet Order:  Diet NPO time specified  EDUCATION NEEDS:   No education needs have been identified at this time  Skin:  Skin Assessment: Reviewed RN Assessment  Last BM:  05/31/2017  Height:   Ht Readings from Last 1 Encounters:  05/31/17 5\' 3"  (1.6 m)    Weight:   Wt Readings from Last 1 Encounters:  06/03/17 102 lb 1.2 oz (46.3 kg)    Ideal Body Weight:  52.2 kg  BMI:  Body mass index is 18.08 kg/m.  Estimated Nutritional Needs:   Kcal:  1,200-1,400 kcal  Protein:  60-70 grams  Fluid:  Per MD (strict I&O)  Sierra AnoWilliam M. Emalene Welte, MS, RD LDN Inpatient Clinical Dietitian Pager [phone]

## 2017-06-03 NOTE — Care Management Important Message (Signed)
Important Message  Patient Details  Name: Sierra Giles Nohr MRN: [id number] Date of Birth: 06/29/1926   Medicare Important Message Given:  Yes    Shirl Ludington 06/03/2017, 4:18 PM

## 2017-06-04 LAB — CBC WITH DIFFERENTIAL/PLATELET
BASOS ABS: 0 10*3/uL (ref 0.0–0.1)
BASOS PCT: 0 %
EOS ABS: 0.1 10*3/uL (ref 0.0–0.7)
EOS PCT: 1 %
HCT: 32.6 % — ABNORMAL LOW (ref 36.0–46.0)
Hemoglobin: 10.5 g/dL — ABNORMAL LOW (ref 12.0–15.0)
Lymphocytes Relative: 6 %
Lymphs Abs: 1.1 10*3/uL (ref 0.7–4.0)
MCH: 28.2 pg (ref 26.0–34.0)
MCHC: 32.2 g/dL (ref 30.0–36.0)
MCV: 87.6 fL (ref 78.0–100.0)
MONO ABS: 0.5 10*3/uL (ref 0.1–1.0)
Monocytes Relative: 3 %
Neutro Abs: 16.2 10*3/uL — ABNORMAL HIGH (ref 1.7–7.7)
Neutrophils Relative %: 90 %
PLATELETS: 426 10*3/uL — AB (ref 150–400)
RBC: 3.72 MIL/uL — ABNORMAL LOW (ref 3.87–5.11)
RDW: 14.1 % (ref 11.5–15.5)
WBC: 18 10*3/uL — ABNORMAL HIGH (ref 4.0–10.5)

## 2017-06-04 LAB — BASIC METABOLIC PANEL
ANION GAP: 11 (ref 5–15)
BUN: 10 mg/dL (ref 6–20)
CALCIUM: 7.9 mg/dL — AB (ref 8.9–10.3)
CO2: 26 mmol/L (ref 22–32)
Chloride: 99 mmol/L — ABNORMAL LOW (ref 101–111)
Creatinine, Ser: 0.54 mg/dL (ref 0.44–1.00)
GLUCOSE: 129 mg/dL — AB (ref 65–99)
Potassium: 3.3 mmol/L — ABNORMAL LOW (ref 3.5–5.1)
SODIUM: 136 mmol/L (ref 135–145)

## 2017-06-04 LAB — GLUCOSE, CAPILLARY
GLUCOSE-CAPILLARY: 118 mg/dL — AB (ref 65–99)
GLUCOSE-CAPILLARY: 105 mg/dL — AB (ref 65–99)
Glucose-Capillary: 109 mg/dL — ABNORMAL HIGH (ref 65–99)
Glucose-Capillary: 109 mg/dL — ABNORMAL HIGH (ref 65–99)
Glucose-Capillary: 113 mg/dL — ABNORMAL HIGH (ref 65–99)
Glucose-Capillary: 133 mg/dL — ABNORMAL HIGH (ref 65–99)

## 2017-06-04 LAB — PHOSPHORUS: Phosphorus: 2.8 mg/dL (ref 2.5–4.6)

## 2017-06-04 LAB — MAGNESIUM: Magnesium: 1.8 mg/dL (ref 1.7–2.4)

## 2017-06-04 MED ORDER — FLUCONAZOLE 40 MG/ML PO SUSR
100.0000 mg | Freq: Every day | ORAL | Status: DC
  Administered 2017-06-04 – 2017-06-07 (×4): 100 mg
  Filled 2017-06-04 (×4): qty 2.5

## 2017-06-04 MED ORDER — LORAZEPAM 0.5 MG PO TABS
0.5000 mg | ORAL_TABLET | Freq: Every day | ORAL | Status: DC
  Administered 2017-06-04 – 2017-06-07 (×4): 0.5 mg via ORAL
  Filled 2017-06-04 (×4): qty 1

## 2017-06-04 MED ORDER — LORAZEPAM 0.5 MG PO TABS
0.5000 mg | ORAL_TABLET | Freq: Once | ORAL | Status: AC
  Administered 2017-06-04: 0.5 mg via ORAL
  Filled 2017-06-04: qty 1

## 2017-06-04 MED ORDER — POTASSIUM CHLORIDE CRYS ER 20 MEQ PO TBCR
40.0000 meq | EXTENDED_RELEASE_TABLET | Freq: Once | ORAL | Status: AC
  Administered 2017-06-04: 40 meq via ORAL
  Filled 2017-06-04: qty 2

## 2017-06-04 NOTE — Evaluation (Addendum)
Physical Therapy Evaluation Patient Details Name: Sierra Giles MRN: [id number] DOB: 07/22/1926 Today's Date: 06/04/2017   History of Present Illness  Pt is an 84 y/o female admitted secondary to SOB. CT of chest revealed multifocal consolidation consistent with pneumonia, and a pancreatic lesion. PMH includes HTN.   Clinical Impression  Pt admitted secondary to problem above with deficits below. Pt very weak and unsteady requiring mod to max A to perform bed mobility and to stand at EOB. Pt with posterior lean in standing, therefore, further mobility deferred. VSS throughout. Supine HEP initiated and reviewed/practiced incentive spirometry. Feel pt will need post acute rehab at d/c to ensure safety with mobility prior to return home. Will continue to follow acutely to maximize functional mobility independence and safety.     Follow Up Recommendations SNF;Supervision/Assistance - 24 hour    Equipment Recommendations  None recommended by PT    Recommendations for Other Services       Precautions / Restrictions Precautions Precautions: Fall;Other (comment) Precaution Comments: NG tube Restrictions Weight Bearing Restrictions: No      Mobility  Bed Mobility Overal bed mobility: Needs Assistance Bed Mobility: Supine to Sit     Supine to sit: Mod assist     General bed mobility comments: Mod A for assisting with trunk elevation and for scooting hips to EOB using bed bad. Max A for return to supine, for controlled trunk descent and LE lift assist.   Transfers Overall transfer level: Needs assistance Equipment used: 1 person hand held assist Transfers: Sit to/from Stand Sit to Stand: Max assist         General transfer comment: Max A for lift assist and steadying. Stood in front of pt to block knees and had pt holding to PT arms.  Pt with posterior lean upon standing, therefore unsafe to attempt further mobility.   Ambulation/Gait             General Gait Details:  unsafe to attempt   Stairs            Wheelchair Mobility    Modified Rankin (Stroke Patients Only)       Balance Overall balance assessment: Needs assistance Sitting-balance support: Single extremity supported;Feet supported Sitting balance-Leahy Scale: Poor Sitting balance - Comments: Reliant on Min guard to min A. Demonstrated R lateral lean in sitting, however, able to correct with assist and cues.  Postural control: Posterior lean Standing balance support: Bilateral upper extremity supported Standing balance-Leahy Scale: Poor Standing balance comment: Reliant on UE and external assist to stand. Posterior lean in standing.                              Pertinent Vitals/Pain Pain Assessment: No/denies pain Faces Pain Scale: Hurts little more Pain Location: grimacing while swallowing Pain Descriptors / Indicators: Grimacing;Sore Pain Intervention(s): Limited activity within patient's tolerance;Monitored during session    Home Living Family/patient expects to be discharged to:: Private residence Living Arrangements: Spouse/significant other Available Help at Discharge: Family;Available 24 hours/day Type of Home: House Home Access: Level entry     Home Layout: One level Home Equipment: Walker - 2 wheels      Prior Function Level of Independence: Independent with assistive device(s)         Comments: Reports she used RW at home.      Hand Dominance        Extremity/Trunk Assessment   Upper Extremity Assessment Upper Extremity Assessment: Defer  to OT evaluation    Lower Extremity Assessment Lower Extremity Assessment: Generalized weakness;RLE deficits/detail;LLE deficits/detail RLE Deficits / Details: Some gastroc tightness noted, however, able to get ankle to neutral. Able to perform ankle pumps, heel slides, and hip ab/adduction in supine.  LLE Deficits / Details: Able to perform ankle pumps, heel slides, and hip ab/adduction in supine.      Cervical / Trunk Assessment Cervical / Trunk Assessment: Kyphotic  Communication   Communication: HOH  Cognition Arousal/Alertness: Awake/alert Behavior During Therapy: WFL for tasks assessed/performed Overall Cognitive Status: No family/caregiver present to determine baseline cognitive functioning                                 General Comments: Slowed cognitive processing noted, however, unsure if this is baseline for pt.       General Comments General comments (skin integrity, edema, etc.): Feeding tube held during session, until pt returned to proper positioning. VSS throughout.     Exercises General Exercises - Lower Extremity Ankle Circles/Pumps: AROM;Both;10 reps Long Arc Quad: AROM;Both;10 reps Hip ABduction/ADduction: AROM;Both;10 reps;Supine Other Exercises Other Exercises: Pt requesting to perform incentive spirometry. Performed 10 breaths during session. Pt able to get to 250 on average. REquired cues for technique.    Assessment/Plan    PT Assessment Patient needs continued PT services  PT Problem List Cardiopulmonary status limiting activity;Decreased strength;Decreased balance;Decreased mobility;Decreased cognition;Decreased knowledge of use of DME;Decreased knowledge of precautions       PT Treatment Interventions DME instruction;Gait training;Stair training;Therapeutic exercise;Therapeutic activities;Functional mobility training;Balance training;Neuromuscular re-education;Cognitive remediation;Patient/family education    PT Goals (Current goals can be found in the Care Plan section)  Acute Rehab PT Goals Patient Stated Goal: to get better  PT Goal Formulation: With patient Time For Goal Achievement: 06/18/17 Potential to Achieve Goals: Fair    Frequency Min 2X/week   Barriers to discharge        Co-evaluation               AM-PAC PT "6 Clicks" Daily Activity  Outcome Measure Difficulty turning over in bed (including  adjusting bedclothes, sheets and blankets)?: Unable Difficulty moving from lying on back to sitting on the side of the bed? : Unable Difficulty sitting down on and standing up from a chair with arms (e.g., wheelchair, bedside commode, etc,.)?: Unable Help needed moving to and from a bed to chair (including a wheelchair)?: Total Help needed walking in hospital room?: Total Help needed climbing 3-5 steps with a railing? : Total 6 Click Score: 6    End of Session Equipment Utilized During Treatment: Gait belt;Oxygen Activity Tolerance: Patient tolerated treatment well Patient left: in bed;with call bell/phone within reach;with bed alarm set Nurse Communication: Mobility status PT Visit Diagnosis: Unsteadiness on feet (R26.81);Muscle weakness (generalized) (M62.81)    Time: 1610-9604 PT Time Calculation (min) (ACUTE ONLY): 24 min   Charges:   PT Evaluation $PT Eval Moderate Complexity: 1 Mod PT Treatments $Therapeutic Activity: 8-22 mins   PT G Codes:        Gladys Damme, PT, DPT  Acute Rehabilitation Services  Pager: [phone]   Lehman Prom 06/04/2017, 6:32 PM

## 2017-06-04 NOTE — Progress Notes (Signed)
Pharmacy Antibiotic Note Sierra Giles is a 84 y.o. female admitted on 05/30/2017. Now with concern for aspiration pneumonia and oropharangeal candidiasis .  Pharmacy has been consulted for Zosyn and fluconazole dosing.  Patient had CorTrak feeding tube placed yesterday. Renal function is stable. WBC still elevated at 18, afebrile.  Plan: Zosyn 3.375g IV q8h (4 hour infusion).  Fluconazole 100mg  PT daily Follow clinical progression, renal function, LOT for each  Height: 5\' 3"  (160 cm) Weight: 95 lb 10.9 oz (43.4 kg) IBW/kg (Calculated) : 52.4  Temp (24hrs), Avg:98.3 F (36.8 C), Min:97.7 F (36.5 C), Max:98.8 F (37.1 C)  Recent Labs  Lab 05/31/17 0421 06/01/17 0153 06/02/17 0217 06/03/17 0448 06/04/17 0256  WBC 25.4* 21.6* 19.2* 17.6* 18.0*  CREATININE 0.63 0.55 0.49 0.55 0.54    Estimated Creatinine Clearance: 33.3 mL/min (by C-G formula based on SCr of 0.54 mg/dL).    Allergies  Allergen Reactions  . Sulfamethoxazole-Trimethoprim Hives  . Amlodipine Swelling  . Hydrochlorothiazide Other (See Comments)    Hyponatremia   . Nitrofuran Derivatives Other (See Comments)    Unknown reaction  . Zolpidem Nausea And Vomiting and Other (See Comments)    Confusion     Azith 2/7>>2/12 CTX 2/7>>2/10 Zosyn 2/10>> Fluc 2/10>>  2/8 BCx: ngtd 2/8 urine: insig growth   Thank you for allowing pharmacy to be a part of this patient's care.  Carlo Lorson D. Sailor Hevia, PharmD, BCPS Clinical Pharmacist Clinical Phone for 06/04/2017 until 3:30pm: [phone] If after 3:30pm, please call main pharmacy at [phone] 06/04/2017 11:13 AM

## 2017-06-04 NOTE — Plan of Care (Signed)
Progressing

## 2017-06-04 NOTE — Progress Notes (Addendum)
PROGRESS NOTE  Sierra Giles ZOX:[id number] DOB: 1926-10-02 DOA: 05/30/2017 PCP: Cheron Schaumann., MD  HPI/Recap of past 24 hours: Sierra Giles is a 84 y.o. female with medical history significant for generalized anxiety disorder and hypertension, now presenting to the emergency department for evaluation of cough, SOB and outpatient chest x-ray suggestive of pneumonia with ongoing symptoms of cough, dyspnea and odynophagia for 2 weeks which progressively got worse. Sierra Giles was unable to tolerate her outpt med prescribed due to odynophagia. A rapid strep and influenza PCR were negative at her PCPs clinic. In the ED, Sierra Giles was saturating 88% on room air, tachypneic, and hypertensive. CT chest was performed and reveals a multifocal consolidation bilaterally suspicious for multifocal pneumonia. Sierra Giles was admitted for further management.  Today, Sierra Giles reported feeling much better, improving odynophagia. Denies any chest pain, worsening SOB, abdominal pain, diarrhea, fever/chills.   Assessment/Plan: Principal Problem:   CAP (community acquired pneumonia) Active Problems:   Acute respiratory failure with hypoxia (HCC)   Normocytic anemia   Hypokalemia   Pancreatic lesion   Anxiety   Hypertension   Protein-calorie malnutrition, severe  Sepsis due to multifocal CAP/Aspiration pneumonitis Afebrile, persistent leukocytosis CT showing multifocal pneumonia, repeat CXR persistent multi-focal PNA BC, NGTD, procalcitonin trending downwards, influenza neg Continue IV Zosyn to cover aspiration pna for a total of 3/7 days, may switch to PO once sig improvement in WBC Completed 7 days of IV azithromycin Sierra Giles/OT  Acute hypoxic resp failure due to multifocal CAP/Aspiration pneumonitis Still requiring O2 CT as above O2, duoneb SLP performed MBS with severe aspiration risk, rec NPO, will follow   Dysphagia/Odynophagia ??oral thrush noted on exam Started on nystatin suspension, switched to fluconazole  for a total of 10 days, continue magic mouthwash/lidocaine, chloraseptic mouth spray Severe risk of aspiration, NPO, aspiration precautions Continue tube feedings, started on 06/03/17, nutrition on board. Consider stopping, pending SLP re-evaluation  Hypertension Stable Continue hydralazine and clonidine patch, may switch to PO   Hypokalemia  Replace prn Daily BMET   Anxiety  Continue Lexapro and low-dose Xanax    Hx of pancreatic lesion CT Chest showed upper abdomen with cystic pancreatic lesion  Follow-up with outpatient MRI recommended       Code Status: Full  Family Communication: Discussed with son who is a cardiologist, husband  Disposition Plan: To be determined   Consultants:  None  Procedures:  Cotrak placement on 06/03/17  Antimicrobials:  IV Zosyn  DVT prophylaxis: Lovenox   Objective: Vitals:   06/03/17 2252 06/04/17 0441 06/04/17 0511 06/04/17 0654  BP: (!) 181/73  (!) 181/74 (!) 175/66  Pulse: 98  77   Resp: 20  16   Temp: 97.7 F (36.5 C)  98.5 F (36.9 C)   TempSrc:      SpO2: 93%  98%   Weight:  43.4 kg (95 lb 10.9 oz)    Height:        Intake/Output Summary (Last 24 hours) at 06/04/2017 1058 Last data filed at 06/04/2017 8119 Gross per 24 hour  Intake 280 ml  Output 1150 ml  Net -870 ml   Filed Weights   06/01/17 0500 06/03/17 0600 06/04/17 0441  Weight: 48.2 kg (106 lb 4.2 oz) 46.3 kg (102 lb 1.2 oz) 43.4 kg (95 lb 10.9 oz)    Exam:   General: Alert, awake, appears frail, looks better this am  Cardiovascular: S1, S2 present, no added heart sound  Respiratory: Mild rhonchi noted bilaterally  Abdomen: Soft, nondistended, nontender, bowel sounds  present  Musculoskeletal: No bilateral pedal edema  Skin: Normal  Psychiatry: Normal mood   Data Reviewed: CBC: Recent Labs  Lab 05/31/17 0421 06/01/17 0153 06/02/17 0217 06/03/17 0448 06/04/17 0256  WBC 25.4* 21.6* 19.2* 17.6* 18.0*  NEUTROABS 23.9* 20.1* 17.3*  16.0* 16.2*  HGB 9.6* 9.2* 9.4* 9.5* 10.5*  HCT 29.7* 29.1* 29.5* 29.6* 32.6*  MCV 86.1 87.4 88.3 87.6 87.6  PLT 310 324 336 342 426*   Basic Metabolic Panel: Recent Labs  Lab 05/31/17 0421 06/01/17 0153 06/02/17 0217 06/03/17 0448 06/04/17 0256  NA 138 138 138 139 136  K 3.0* 3.5 3.7 3.5 3.3*  CL 102 103 104 103 99*  CO2 23 24 24 25 26   GLUCOSE 95 112* 123* 115* 129*  BUN 21* 20 22* 13 10  CREATININE 0.63 0.55 0.49 0.55 0.54  CALCIUM 7.9* 7.8* 7.7* 7.7* 7.9*  MG 2.5*  --   --   --   --    GFR: Estimated Creatinine Clearance: 33.3 mL/min (by C-G formula based on SCr of 0.54 mg/dL). Liver Function Tests: Recent Labs  Lab 05/30/17 1642  AST 26  ALT 18  ALKPHOS 114  BILITOT 0.9  PROT 7.5  ALBUMIN 2.7*   No results for input(s): LIPASE, AMYLASE in the last 168 hours. No results for input(s): AMMONIA in the last 168 hours. Coagulation Profile: No results for input(s): INR, PROTIME in the last 168 hours. Cardiac Enzymes: No results for input(s): CKTOTAL, CKMB, CKMBINDEX, TROPONINI in the last 168 hours. BNP (last 3 results) No results for input(s): PROBNP in the last 8760 hours. HbA1C: No results for input(s): HGBA1C in the last 72 hours. CBG: Recent Labs  Lab 06/03/17 1627 06/03/17 2003 06/04/17 0022 06/04/17 0428 06/04/17 0741  GLUCAP 108* 102* 109* 118* 133*   Lipid Profile: No results for input(s): CHOL, HDL, LDLCALC, TRIG, CHOLHDL, LDLDIRECT in the last 72 hours. Thyroid Function Tests: No results for input(s): TSH, T4TOTAL, FREET4, T3FREE, THYROIDAB in the last 72 hours. Anemia Panel: No results for input(s): VITAMINB12, FOLATE, FERRITIN, TIBC, IRON, RETICCTPCT in the last 72 hours. Urine analysis:    Component Value Date/Time   COLORURINE YELLOW 05/30/2017 1818   APPEARANCEUR CLOUDY (A) 05/30/2017 1818   LABSPEC 1.015 05/30/2017 1818   PHURINE 6.0 05/30/2017 1818   GLUCOSEU NEGATIVE 05/30/2017 1818   HGBUR NEGATIVE 05/30/2017 1818   BILIRUBINUR  SMALL (A) 05/30/2017 1818   KETONESUR 15 (A) 05/30/2017 1818   PROTEINUR 30 (A) 05/30/2017 1818   UROBILINOGEN 0.2 07/14/2014 2012   NITRITE NEGATIVE 05/30/2017 1818   LEUKOCYTESUR TRACE (A) 05/30/2017 1818   Sepsis Labs: @LABRCNTIP (procalcitonin:4,lacticidven:4)  ) Recent Results (from the past 240 hour(s))  Culture, blood (routine x 2) Call MD if unable to obtain prior to antibiotics being given     Status: None (Preliminary result)   Collection Time: 05/31/17  7:06 AM  Result Value Ref Range Status   Specimen Description BLOOD RIGHT FOREARM  Final   Special Requests   Final    BOTTLES DRAWN AEROBIC AND ANAEROBIC Blood Culture adequate volume   Culture   Final    NO GROWTH 4 DAYS Performed at [facility], [address] [place], [place] [address]    Report Status PENDING  Incomplete  Culture, blood (routine x 2) Call MD if unable to obtain prior to antibiotics being given     Status: None (Preliminary result)   Collection Time: 05/31/17  7:10 AM  Result Value Ref Range  Status   Specimen Description BLOOD LEFT ANTECUBITAL  Final   Special Requests IN PEDIATRIC BOTTLE Blood Culture adequate volume  Final   Culture   Final    NO GROWTH 4 DAYS Performed at [facility], [address] [place], [place] [address]    Report Status PENDING  Incomplete  Culture, Urine     Status: Abnormal   Collection Time: 05/31/17  6:42 PM  Result Value Ref Range Status   Specimen Description URINE, CLEAN CATCH  Final   Special Requests NONE  Final   Culture (A)  Final    <10,000 COLONIES/mL INSIGNIFICANT GROWTH Performed at [facility], [address] [place], [place] [address]    Report Status 06/01/2017 FINAL  Final      Studies: No results found.  Scheduled Meds: . aspirin EC  81 mg Oral QODAY  . cloNIDine  0.3 mg Transdermal Weekly  . enoxaparin (LOVENOX) injection  40 mg Subcutaneous Q24H  . feeding supplement (ENSURE ENLIVE)  237 mL Oral BID BM  .  hydrALAZINE  100 mg Oral Q8H  . LORazepam  0.5 mg Oral QHS  . pantoprazole (PROTONIX) IV  40 mg Intravenous Q24H  . sodium chloride flush  3 mL Intravenous Q12H    Continuous Infusions: . feeding supplement (JEVITY 1.2 CAL) 1,000 mL (06/03/17 1538)  . fluconazole (DIFLUCAN) IV Stopped (06/03/17 1411)  . piperacillin-tazobactam (ZOSYN)  IV Stopped (06/04/17 0929)     LOS: 5 days     Briant CedarNkeiruka J Wiliam Cauthorn, MD [facility]   If 7PM-7AM, please contact night-coverage [email or web address] Password [email or web address] 06/04/2017, 10:58 AM

## 2017-06-04 NOTE — Progress Notes (Signed)
  Speech Language Pathology Treatment: Dysphagia  Patient Details Name: Sierra Giles MRN: [id number] DOB: 01/26/1927 Today's Date: 06/04/2017 Time: 1523-1600 SLP Time Calculation (min) (ACUTE ONLY): 37 min  Assessment / Plan / Recommendation Clinical Impression  Pt was seen for f/u dysphagia tx with Mod cues provided for accurate completion of effortful swallows and CTAR. Pt continues to have coughing with ice chips, even with some expectoration. Coughing was also noted during completion of oral care. She is not yet ready for repeat swallow study or PO diet.   Pt's son arrived after tx was completed with questions regarding plan of care. SLP reviewed exercises in depth with him and provided Min cues to pt to perform a few repetitions of each for him so that SLP could demonstrate accurate completion and cueing methods. SLP also reviewed the importance of oral care and its frequency. He explains that so far the pt's husband has been completing oral care but he has been using it with an empty toothbrush (nothing to cleanse the mouth). SLP provided education about the need to truly clean the mouth, and shared with him the potential to do oral care via suction to also keep the pt safe. Would continue current exercise regimen.    HPI HPI: Sierra CongerLamarie Clinardis a 84 y.o.femalewith medical history significant forgeneralized anxiety disorder and hypertension, now presenting to the emergency department for evaluation of cough, shortness of breath, and outpatient chest x-ray suggestive of pneumonia. Patient reports that she had been in her usual state until approximately 2 weeks ago when she noted the insidious development of cough and dyspnea. Symptoms have worsened significantly over the past several days, she saw her PCP yesterday for these complaints, was diagnosed with pneumonia, and started on an antibiotic. She reports a sore throat that has made it difficult to tolerate the oral antibiotic. She  reports that rapid strep and influenza PCR were negative at her PCPs clinic. She denies fevers, chills, chest pain, or leg swelling or tenderness. Reports that her cough is nonproductive.  CHest CT is showing multifocal consolidation in bilateral lungs with adjacent ground glass opacity.        SLP Plan  Continue with current plan of care       Recommendations  Diet recommendations: NPO(except ice chips) Liquids provided via: Teaspoon Medication Administration: Via alternative means                Oral Care Recommendations: Oral care QID;Oral care prior to ice chip/H20 Follow up Recommendations: Other (comment)(SLP f/u at next level of care) SLP Visit Diagnosis: Dysphagia, oropharyngeal phase (R13.12) Plan: Continue with current plan of care       GO                Sierra Giles, Sierra Giles 06/04/2017, 4:09 PM  Sierra Giles, Sierra Giles [phone]

## 2017-06-05 LAB — CBC WITH DIFFERENTIAL/PLATELET
BASOS ABS: 0 10*3/uL (ref 0.0–0.1)
Basophils Relative: 0 %
Eosinophils Absolute: 0.2 10*3/uL (ref 0.0–0.7)
Eosinophils Relative: 1 %
HEMATOCRIT: 30.6 % — AB (ref 36.0–46.0)
Hemoglobin: 10.1 g/dL — ABNORMAL LOW (ref 12.0–15.0)
LYMPHS ABS: 1.3 10*3/uL (ref 0.7–4.0)
LYMPHS PCT: 9 %
MCH: 29.2 pg (ref 26.0–34.0)
MCHC: 33 g/dL (ref 30.0–36.0)
MCV: 88.4 fL (ref 78.0–100.0)
Monocytes Absolute: 0.4 10*3/uL (ref 0.1–1.0)
Monocytes Relative: 3 %
NEUTROS ABS: 12.7 10*3/uL — AB (ref 1.7–7.7)
Neutrophils Relative %: 87 %
Platelets: 457 10*3/uL — ABNORMAL HIGH (ref 150–400)
RBC: 3.46 MIL/uL — AB (ref 3.87–5.11)
RDW: 14.4 % (ref 11.5–15.5)
WBC: 14.5 10*3/uL — ABNORMAL HIGH (ref 4.0–10.5)

## 2017-06-05 LAB — GLUCOSE, CAPILLARY
GLUCOSE-CAPILLARY: 106 mg/dL — AB (ref 65–99)
GLUCOSE-CAPILLARY: 128 mg/dL — AB (ref 65–99)
GLUCOSE-CAPILLARY: 130 mg/dL — AB (ref 65–99)
Glucose-Capillary: 148 mg/dL — ABNORMAL HIGH (ref 65–99)
Glucose-Capillary: 128 mg/dL — ABNORMAL HIGH (ref 65–99)
Glucose-Capillary: 130 mg/dL — ABNORMAL HIGH (ref 65–99)

## 2017-06-05 LAB — BASIC METABOLIC PANEL
Anion gap: 10 (ref 5–15)
BUN: 12 mg/dL (ref 6–20)
CO2: 27 mmol/L (ref 22–32)
Calcium: 8 mg/dL — ABNORMAL LOW (ref 8.9–10.3)
Chloride: 101 mmol/L (ref 101–111)
Creatinine, Ser: 0.54 mg/dL (ref 0.44–1.00)
GFR calc Af Amer: >60 mL/min (ref >60)
GFR calc non Af Amer: >60 mL/min (ref >60)
GLUCOSE: 140 mg/dL — AB (ref 65–99)
POTASSIUM: 3.5 mmol/L (ref 3.5–5.1)
Sodium: 138 mmol/L (ref 135–145)

## 2017-06-05 LAB — CULTURE, BLOOD (ROUTINE X 2)
CULTURE: NO GROWTH
Culture: NO GROWTH
SPECIAL REQUESTS: ADEQUATE
Special Requests: ADEQUATE

## 2017-06-05 NOTE — Clinical Social Work Note (Signed)
Clinical Social Work Assessment  Patient Details  Name: Sierra Giles MRN: [id number] Date of Birth: 09/24/1926  Date of referral:  06/05/17               Reason for consult:  Facility Placement                Permission sought to share information with:  Facility Medical sales representativeContact Representative, Family Supports Permission granted to share information::  Yes, Verbal Permission Granted  Name::        Agency::  SNFs  Relationship::  Sons  Contact Information:     Housing/Transportation Living arrangements for the past 2 months:  Single Family Home Source of Information:  Patient, Adult Children Patient Interpreter Needed:  None Criminal Activity/Legal Involvement Pertinent to Current Situation/Hospitalization:  No - Comment as needed Significant Relationships:  Adult Children, Spouse Lives with:  Spouse Do you feel safe going back to the place where you live?  Yes Need for family participation in patient care:  Yes (Comment)  Care giving concerns:  CSW received consult for possible SNF placement at time of discharge. CSW spoke with patient's son regarding PT recommendation of SNF placement at time of discharge. Patient's son reported that patient's spouse is currently caring for patient and son also checks on them throughout the day. Other son is a Librarian, academicdoctor in [place]. Patient's son expressed understanding of PT recommendation and would rather be able to get patient back home instead of having to go to a SNF, but is willing to consider the idea. CSW to continue to follow and assist with discharge planning needs.   Social Worker assessment / plan:  CSW spoke with patient's son concerning [facility] before [facility].  Employment status:  Retired Health and safety inspectornsurance information:  Medicare PT Recommendations:  Skilled Nursing Facility Information / Referral to community resources:  Skilled Nursing Facility  Patient/Family's Response to care:  Patient's son recognizes need for rehab  before returning home and may be agreeable to snf if needed. [place]   Patient/Family's Understanding of and Emotional Response to Diagnosis, Current Treatment, and Prognosis:  Patient/family is realistic regarding therapy needs and expressed being hopeful for SNF placement. Patient's son expressed understanding of CSW role and discharge process as well as medical condition. No questions/concerns about plan or treatment.    Emotional Assessment Appearance:  Appears stated age Attitude/Demeanor/Rapport:  Unable to Assess(Patient bathing while CSW spoke with son) Affect (typically observed):  Quiet Orientation:  Oriented to Self, Oriented to Situation, Oriented to  Time, Oriented to Place Alcohol / Substance use:  Not Applicable Psych involvement (Current and /or in the community):  No (Comment)  Discharge Needs  Concerns to be addressed:  Care Coordination Readmission within the last 30 days:  No Current discharge risk:  Dependent with Mobility Barriers to Discharge:  Continued Medical Work up   [employer] S Loeta Herst, LCSWA 06/05/2017, 8:28 AM

## 2017-06-05 NOTE — Progress Notes (Signed)
PROGRESS NOTE    Sierra Giles  ZOX:[id number] DOB: 06/26/1926 DOA: 05/30/2017 PCP: Cheron SchaumannVelazquez, Gretchen Y., MD    Brief Narrative:  84 -year-old female who presents with cough and dyspnea. She does have the significant past medical history for generalized anxiety and hypertension. She had 2 weeks of persistent cough and dyspnea, she was diagnosed with pneumonia by her primary care provider, unfortunately unable to tolerate by mouth antibiotic therapy. On initial physical examination pressure 160/76, heart rate 92, respiratory 21, temperature 100.1, oxygen saturation 95%, ill-looking appearing, moist mucous membranes, lungs with increased work of breathing, bilateral rhonchi, positive tachypnea, heart S1-S2 present rhythmic, tachycardic, the abdomen was soft nontender, no lower extremity edema. Sodium 138, potassium 3.2, chloride 100, bicarbonate 25, BUN 31, creatinine 0.68, white count 32.0, hemoglobin 11.2, hematocrit 33.6, platelets 376. Urinalysis negative for infection. Chest x-ray with hyperinflation, right lower lobe infiltrate, CT chest with multilobar infiltrates.  Patient was admitted to the hospital working diagnosis of sepsis due to multifocal pneumonia.   Assessment & Plan:   Principal Problem:   CAP (community acquired pneumonia) Active Problems:   Acute respiratory failure with hypoxia (HCC)   Normocytic anemia   Hypokalemia   Pancreatic lesion   Anxiety   Hypertension   Protein-calorie malnutrition, severe   1. Multifocal pneumonia. Chest film and chest CT personally reviewed noted multilobar infiltrates, suggesting aspiration pneumonia. Will continue antibiotic therapy with Zosyn IV, aspiration precautions. Oxymetry 95% on room air.   2. Swallow dysfunction. Patient has failed swallow evaluation, recommendation to keep NPO and continue aspiration precautions. Positive NG tube with good toleration of tube feedings. I spoke with patient's husband at the bedside about peg  tube, I will call her son as well.   3. Hypertension. Will continue blood pressure control with hydralazine, and clonidine.   4. Protein-calorie malnutrition. Will continue nutritional supplements, will need physical therapy as tolerated. Out of bed to chair.    DVT prophylaxis: enoxaparin   Code Status: full Family Communication:  I spoke with patient's husband at the bedside and all questions were addressed.  Disposition Plan: snf   Consultants:     Procedures:     Antimicrobials:       Subjective: Patient feeling well, very weak and deconditioned, tolerating tube feedings, with no nausea or vomiting. Still recommended to stay NPO.   Objective: Vitals:   06/04/17 1334 06/04/17 2043 06/05/17 0431 06/05/17 0627  BP: (!) 173/68 (!) 165/74 (!) 182/67 (!) 173/65  Pulse:  93 90 84  Resp:  17 17   Temp:  98.3 F (36.8 C) 97.9 F (36.6 C)   TempSrc:  Oral Axillary   SpO2:  96% 97%   Weight:      Height:        Intake/Output Summary (Last 24 hours) at 06/05/2017 1214 Last data filed at 06/05/2017 0900 Gross per 24 hour  Intake 400 ml  Output 500 ml  Net -100 ml   Filed Weights   06/01/17 0500 06/03/17 0600 06/04/17 0441  Weight: 48.2 kg (106 lb 4.2 oz) 46.3 kg (102 lb 1.2 oz) 43.4 kg (95 lb 10.9 oz)    Examination:   General: Not in pain or dyspnea, deconditioned Neurology: Awake and alert, non focal  E ENT: positive pallor, no icterus, oral mucosa moist. NG tube in place.  Cardiovascular: No JVD. S1-S2 present, rhythmic, no gallops, rubs, or murmurs. No lower extremity edema. Pulmonary: vesicular breath sounds bilaterally, adequate air movement, no wheezing, rhonchi or rales. Decreased  breath sounds at bases due to poor inspiratory effort.  Gastrointestinal. Abdomen flat, no organomegaly, non tender, no rebound or guarding Skin. No rashes Musculoskeletal: no joint deformities     Data Reviewed: I have personally reviewed following labs and imaging  studies  CBC: Recent Labs  Lab 06/01/17 0153 06/02/17 0217 06/03/17 0448 06/04/17 0256 06/05/17 0209  WBC 21.6* 19.2* 17.6* 18.0* 14.5*  NEUTROABS 20.1* 17.3* 16.0* 16.2* 12.7*  HGB 9.2* 9.4* 9.5* 10.5* 10.1*  HCT 29.1* 29.5* 29.6* 32.6* 30.6*  MCV 87.4 88.3 87.6 87.6 88.4  PLT 324 336 342 426* 457*   Basic Metabolic Panel: Recent Labs  Lab 05/31/17 0421 06/01/17 0153 06/02/17 0217 06/03/17 0448 06/04/17 0256 06/05/17 0209  NA 138 138 138 139 136 138  K 3.0* 3.5 3.7 3.5 3.3* 3.5  CL 102 103 104 103 99* 101  CO2 23 24 24 25 26 27   GLUCOSE 95 112* 123* 115* 129* 140*  BUN 21* 20 22* 13 10 12   CREATININE 0.63 0.55 0.49 0.55 0.54 0.54  CALCIUM 7.9* 7.8* 7.7* 7.7* 7.9* 8.0*  MG 2.5*  --   --   --  1.8  --   PHOS  --   --   --   --  2.8  --    GFR: Estimated Creatinine Clearance: 33.3 mL/min (by C-G formula based on SCr of 0.54 mg/dL). Liver Function Tests: Recent Labs  Lab 05/30/17 1642  AST 26  ALT 18  ALKPHOS 114  BILITOT 0.9  PROT 7.5  ALBUMIN 2.7*   No results for input(s): LIPASE, AMYLASE in the last 168 hours. No results for input(s): AMMONIA in the last 168 hours. Coagulation Profile: No results for input(s): INR, PROTIME in the last 168 hours. Cardiac Enzymes: No results for input(s): CKTOTAL, CKMB, CKMBINDEX, TROPONINI in the last 168 hours. BNP (last 3 results) No results for input(s): PROBNP in the last 8760 hours. HbA1C: No results for input(s): HGBA1C in the last 72 hours. CBG: Recent Labs  Lab 06/04/17 2038 06/05/17 0005 06/05/17 0425 06/05/17 0744 06/05/17 1138  GLUCAP 109* 106* 148* 128* 130*   Lipid Profile: No results for input(s): CHOL, HDL, LDLCALC, TRIG, CHOLHDL, LDLDIRECT in the last 72 hours. Thyroid Function Tests: No results for input(s): TSH, T4TOTAL, FREET4, T3FREE, THYROIDAB in the last 72 hours. Anemia Panel: No results for input(s): VITAMINB12, FOLATE, FERRITIN, TIBC, IRON, RETICCTPCT in the last 72  hours.    Radiology Studies: I have reviewed all of the imaging during this hospital visit personally     Scheduled Meds: . aspirin EC  81 mg Oral QODAY  . cloNIDine  0.3 mg Transdermal Weekly  . enoxaparin (LOVENOX) injection  40 mg Subcutaneous Q24H  . feeding supplement (ENSURE ENLIVE)  237 mL Oral BID BM  . fluconazole  100 mg Per Tube Daily  . hydrALAZINE  100 mg Oral Q8H  . LORazepam  0.5 mg Oral QHS  . pantoprazole (PROTONIX) IV  40 mg Intravenous Q24H  . sodium chloride flush  3 mL Intravenous Q12H   Continuous Infusions: . feeding supplement (JEVITY 1.2 CAL) 1,000 mL (06/04/17 1525)  . piperacillin-tazobactam (ZOSYN)  IV 3.375 g (06/05/17 0508)     LOS: 6 days        Miko Sirico Annett Gula, MD [facility] Pager [phone]

## 2017-06-05 NOTE — Plan of Care (Signed)
Progressing

## 2017-06-05 NOTE — Progress Notes (Signed)
Spoke to patient's son wanted to speak to Dr. Ella JubileeArrien regarding POC. Texted Dr. Ella JubileeArrien with message to call. Spoke to Alcoa IncVickie, RN she had texted Dr. Ella JubileeArrien earlier in the day to call son.

## 2017-06-05 NOTE — Evaluation (Signed)
Occupational Therapy Evaluation Patient Details Name: Sierra Giles MRN: [id number] DOB: 03/16/1927 Today's Date: 06/05/2017    History of Present Illness Pt is an 84 y/o female admitted secondary to SOB. CT of chest revealed multifocal consolidation consistent with pneumonia, and a pancreatic lesion. PMH includes HTN.    Clinical Impression   Pt reports she was fairly independent with BADL PTA. Currently pt overall min assist for ADL and functional mobility with the exception of mod assist for LB ADL. Recommending SNF for follow up to maximize independence and safety with ADL and functional mobility prior to return home. Pending progress, pt may be able to [facility] and 24/7 supervision. Pt would benefit from continued skilled OT to address established goals.    Follow Up Recommendations  SNF(pending progress may be able to go home with 24/7 S and HHOT)    Equipment Recommendations  3 in 1 bedside commode    Recommendations for Other Services       Precautions / Restrictions Precautions Precautions: Fall;Other (comment) Precaution Comments: NG tube Restrictions Weight Bearing Restrictions: No      Mobility Bed Mobility Overal bed mobility: Needs Assistance Bed Mobility: Supine to Sit     Supine to sit: Min guard     General bed mobility comments: Incerased time and effort but no physical assist required.  Transfers Overall transfer level: Needs assistance Equipment used: 1 person hand held assist Transfers: Sit to/from UGI CorporationStand;Stand Pivot Transfers Sit to Stand: Min assist Stand pivot transfers: Min assist       General transfer comment: Min assist to steady with sit to stand from EOB, able to perform stand pivot to R side with min HHA.    Balance Overall balance assessment: Needs assistance Sitting-balance support: Feet supported Sitting balance-Leahy Scale: Fair     Standing balance support: Bilateral upper extremity supported Standing  balance-Leahy Scale: Poor                             ADL either performed or assessed with clinical judgement   ADL Overall ADL's : Needs assistance/impaired Eating/Feeding: NPO   Grooming: Min guard;Sitting   Upper Body Bathing: Minimal assistance;Sitting   Lower Body Bathing: Moderate assistance;Sit to/from stand   Upper Body Dressing : Minimal assistance;Sitting   Lower Body Dressing: Moderate assistance;Sit to/from stand   Toilet Transfer: Minimal Cabin crewassistance;Stand-pivot Toilet Transfer Details (indicate cue type and reason): Simulated by stand pivot EOB to chair with HHA         Functional mobility during ADLs: Minimal assistance(HHA for stand pivot only)       Vision         Perception     Praxis      Pertinent Vitals/Pain Pain Assessment: No/denies pain     Hand Dominance Right   Extremity/Trunk Assessment Upper Extremity Assessment Upper Extremity Assessment: Generalized weakness   Lower Extremity Assessment Lower Extremity Assessment: Defer to PT evaluation   Cervical / Trunk Assessment Cervical / Trunk Assessment: Kyphotic   Communication Communication Communication: HOH   Cognition Arousal/Alertness: Awake/alert Behavior During Therapy: WFL for tasks assessed/performed Overall Cognitive Status: Within Functional Limits for tasks assessed                                 General Comments: Slightly slow processing noted but following all commands and answering questions appropriately.  General Comments       Exercises     Shoulder Instructions      Home Living Family/patient expects to be discharged to:: Private residence Living Arrangements: Spouse/significant other Available Help at Discharge: Family;Available 24 hours/day Type of Home: House Home Access: Level entry     Home Layout: One level     Bathroom Shower/Tub: Tub/shower unit;Curtain   Firefighter: Standard     Home Equipment: Environmental consultant  - 2 wheels          Prior Functioning/Environment Level of Independence: Independent with assistive device(s)        Comments: Reports she used RW at home. Independent with BADL. Husband cooks or goes out for meals.        OT Problem List: Decreased strength;Decreased activity tolerance;Impaired balance (sitting and/or standing);Decreased safety awareness;Decreased knowledge of use of DME or AE;Decreased knowledge of precautions      OT Treatment/Interventions: Self-care/ADL training;Therapeutic exercise;Energy conservation;DME and/or AE instruction;Therapeutic activities;Patient/family education;Balance training    OT Goals(Current goals can be found in the care plan section) Acute Rehab OT Goals Patient Stated Goal: to get better  OT Goal Formulation: With patient Time For Goal Achievement: 06/19/17 Potential to Achieve Goals: Good ADL Goals Pt Will Perform Grooming: with supervision;standing Pt Will Perform Upper Body Bathing: with set-up;sitting Pt Will Perform Lower Body Bathing: with supervision;sit to/from stand Pt Will Transfer to Toilet: with supervision;ambulating;bedside commode Pt Will Perform Toileting - Clothing Manipulation and hygiene: with supervision;sit to/from stand  OT Frequency: Min 2X/week   Barriers to D/C:            Co-evaluation              AM-PAC PT "6 Clicks" Daily Activity     Outcome Measure Help from another person eating meals?: A Little Help from another person taking care of personal grooming?: A Little Help from another person toileting, which includes using toliet, bedpan, or urinal?: A Little Help from another person bathing (including washing, rinsing, drying)?: A Lot Help from another person to put on and taking off regular upper body clothing?: A Little Help from another person to put on and taking off regular lower body clothing?: A Lot 6 Click Score: 16   End of Session Equipment Utilized During Treatment:  Oxygen Nurse Communication: Mobility status;Other (comment)(removed purewick--RN tech notified)  Activity Tolerance: Patient tolerated treatment well Patient left: in chair;with call bell/phone within reach;with chair alarm set;with family/visitor present  OT Visit Diagnosis: Unsteadiness on feet (R26.81);Muscle weakness (generalized) (M62.81)                Time: 1610-9604 OT Time Calculation (min): 17 min Charges:  OT General Charges $OT Visit: 1 Visit OT Evaluation $OT Eval Moderate Complexity: 1 Mod G-Codes:     Abdirizak Richison A. Brett Albino, M.S., OTR/L Pager: [phone]  Gaye Alken 06/05/2017, 4:37 PM

## 2017-06-06 DIAGNOSIS — E43 Unspecified severe protein-calorie malnutrition: Secondary | ICD-10-CM

## 2017-06-06 LAB — CBC WITH DIFFERENTIAL/PLATELET
Basophils Absolute: 0 10*3/uL (ref 0.0–0.1)
Basophils Relative: 0 %
EOS PCT: 2 %
Eosinophils Absolute: 0.1 10*3/uL (ref 0.0–0.7)
HEMATOCRIT: 31.1 % — AB (ref 36.0–46.0)
Hemoglobin: 9.7 g/dL — ABNORMAL LOW (ref 12.0–15.0)
LYMPHS ABS: 1.3 10*3/uL (ref 0.7–4.0)
LYMPHS PCT: 15 %
MCH: 27.8 pg (ref 26.0–34.0)
MCHC: 31.2 g/dL (ref 30.0–36.0)
MCV: 89.1 fL (ref 78.0–100.0)
MONOS PCT: 5 %
Monocytes Absolute: 0.4 10*3/uL (ref 0.1–1.0)
NEUTROS ABS: 6.8 10*3/uL (ref 1.7–7.7)
Neutrophils Relative %: 78 %
PLATELETS: 397 10*3/uL (ref 150–400)
RBC: 3.49 MIL/uL — ABNORMAL LOW (ref 3.87–5.11)
RDW: 14.4 % (ref 11.5–15.5)
WBC: 8.7 10*3/uL (ref 4.0–10.5)

## 2017-06-06 LAB — GLUCOSE, CAPILLARY
GLUCOSE-CAPILLARY: 119 mg/dL — AB (ref 65–99)
GLUCOSE-CAPILLARY: 135 mg/dL — AB (ref 65–99)
GLUCOSE-CAPILLARY: 136 mg/dL — AB (ref 65–99)
Glucose-Capillary: 131 mg/dL — ABNORMAL HIGH (ref 65–99)
Glucose-Capillary: 141 mg/dL — ABNORMAL HIGH (ref 65–99)
Glucose-Capillary: 142 mg/dL — ABNORMAL HIGH (ref 65–99)
Glucose-Capillary: 149 mg/dL — ABNORMAL HIGH (ref 65–99)

## 2017-06-06 NOTE — Plan of Care (Signed)
  Progressing Activity: Risk for activity intolerance will decrease 06/06/2017 0351 - Progressing by Burtis Junesrewery, Jaretssi Kraker, RN Resting majority of evening, denies pain. Q2 turns   Nutrition: Adequate nutrition will be maintained 06/06/2017 0351 - Progressing by Burtis Junesrewery, Jovie Swanner, RN

## 2017-06-06 NOTE — Progress Notes (Signed)
  Speech Language Pathology Treatment: Dysphagia  Patient Details Name: Sierra Giles MRN: [id number] DOB: 10/23/1926 Today's Date: 06/06/2017 Time: [id number] SLP Time Calculation (min) (ACUTE ONLY): 35 min  Assessment / Plan / Recommendation Clinical Impression  Pt was fatigued this afternoon but still agreeable to PO trials. Today she denies any odynophagia and she has less coughing with ice chips. SLP also provided small amounts of water via spoon with one delayed cough noted after completion of trials. Although she is showing subtle signs of improvement clinically, she remains at risk given her overall deconditioning and severity of initial MBS per report.   I shared that with family, who are interested in pursuing MBS to better assess current oropharyngeal function and look for potential signs of improvement. They had questions about Cortrak versus PEG, understanding that an NGT is a temporary means of nutrition, and they share that they would likely want to avoid a PEG if possible. They would however be interested in altered diet textures if it reduced the risk of aspiration.   I did share with them my concern for potentially a more chronic component to her dysphagia given the severity of the initial study, h/o PNA, and her recent weight loss, and her son shared that although he has not had concerns about her swallowing function specifically, he has had some concerns about potential neurological issues at baseline. MBS would likely be helpful to guide the pt and her family in their decision making, but would favor completion on next date given her current level of fatigue. I encouraged her to continue with her current exercises and ice chip trials until then.   HPI HPI: [name] Clinardis a 84 y.o.femalewith medical history significant forgeneralized anxiety disorder and hypertension, now presenting to the emergency department for evaluation of cough, shortness of breath, and outpatient  chest x-ray suggestive of pneumonia. Patient reports that she had been in her usual state until approximately 2 weeks ago when she noted the insidious development of cough and dyspnea. Symptoms have worsened significantly over the past several days, she saw her PCP yesterday for these complaints, was diagnosed with pneumonia, and started on an antibiotic. She reports a sore throat that has made it difficult to tolerate the oral antibiotic. She reports that rapid strep and influenza PCR were negative at her PCPs clinic. She denies fevers, chills, chest pain, or leg swelling or tenderness. Reports that her cough is nonproductive.  CHest CT is showing multifocal consolidation in bilateral lungs with adjacent ground glass opacity.        SLP Plan  MBS       Recommendations  Diet recommendations: NPO;Other(comment)(except for ice chips) Medication Administration: Via alternative means                Oral Care Recommendations: Oral care QID;Oral care prior to ice chip/H20 Follow up Recommendations: Skilled Nursing facility SLP Visit Diagnosis: Dysphagia, oropharyngeal phase (R13.12) Plan: MBS       GO                Maxcine Hamaiewonsky, Naliah Eddington 06/06/2017, 2:38 PM  Maxcine HamLaura Paiewonsky, M.A. CCC-SLP [phone]

## 2017-06-06 NOTE — Progress Notes (Signed)
SLP Cancellation Note  Patient Details Name: Lane HackerLamarie Stamour MRN: [id number] DOB: 08/11/1926   Cancelled treatment:       Reason Eval/Treat Not Completed: Other (comment) Pt currently working with PT/OT, family stepped out to allow her to work with therapy. Per RN, one of pt's sons (who is an MD) wanted to speak with SLP. Will reattempt at a later time.    Maxcine Hamaiewonsky, [address] 06/06/2017, 11:45 AM  Maxcine HamLaura Paiewonsky, M.A. CCC-SLP [phone]

## 2017-06-06 NOTE — Progress Notes (Signed)
Occupational Therapy Treatment Patient Details Name: Sierra Giles MRN: [id number] DOB: 02-08-1927 Today's Date: 06/06/2017    History of present illness Pt is an 84 y/o female admitted secondary to SOB. CT of chest revealed multifocal consolidation consistent with pneumonia, and a pancreatic lesion. PMH includes HTN.    OT comments  Pt progressing well. Stood and sink and performed 2 grooming activities. Min assist to don front opening gown and for toileting, to manage clothing. Continue to recommend SNF for further therapy prior to return home.  Follow Up Recommendations  SNF(pending progress)    Equipment Recommendations  3 in 1 bedside commode    Recommendations for Other Services      Precautions / Restrictions Precautions Precautions: Fall;Other (comment) Precaution Comments: NG tube, Hard of hearing Restrictions Weight Bearing Restrictions: No       Mobility Bed Mobility Overal bed mobility: Needs Assistance Bed Mobility: Supine to Sit     Supine to sit: Supervision     General bed mobility comments: Incerased time and effort but no physical assist required.  Transfers Overall transfer level: Needs assistance Equipment used: Rolling walker (2 wheeled) Transfers: Sit to/from Stand Sit to Stand: Min assist;Min guard         General transfer comment: Min A for sit<>stand from bed and low BSC. Pt able to stand with min guard from recliner to sink. Posterior lean noted    Balance Overall balance assessment: Needs assistance Sitting-balance support: Feet supported Sitting balance-Leahy Scale: Fair Sitting balance - Comments: sat EOB for ~5 min with min guard for safety. Postural control: Posterior lean Standing balance support: During functional activity;Single extremity supported Standing balance-Leahy Scale: Poor Standing balance comment: Stood at sink for ~8 min to perform ADLs. Bracing bil LEs on chair for support                            ADL either performed or assessed with clinical judgement   ADL Overall ADL's : Needs assistance/impaired     Grooming: Oral care;Wash/dry hands;Standing;Min guard Grooming Details (indicate cue type and reason): pt stabilizing LEs on chair during standing         Upper Body Dressing : Minimal assistance;Sitting       Toilet Transfer: Minimal assistance;Stand-pivot;RW;BSC   Toileting- Clothing Manipulation and Hygiene: Minimal assistance;Sit to/from stand Toileting - Clothing Manipulation Details (indicate cue type and reason): able to perform pericare in sitting, assist for gowns     Functional mobility during ADLs: Minimal assistance;Rolling walker(chair following)       Vision       Perception     Praxis      Cognition Arousal/Alertness: Awake/alert Behavior During Therapy: Flat affect Overall Cognitive Status: Within Functional Limits for tasks assessed                                 General Comments: Slightly slow processing noted but following all commands and answering questions appropriately.        Exercises    Shoulder Instructions       General Comments     Pertinent Vitals/ Pain       Pain Assessment: No/denies pain  Home Living Family/patient expects to be discharged to:: Private residence Living Arrangements: Spouse/significant other Available Help at Discharge: Family;Available 24 hours/day Type of Home: House Home Access: Level entry     Home Layout: One level  Bathroom Shower/Tub: IT trainerTub/shower unit;Curtain   Bathroom Toilet: Standard     Home Equipment: Environmental consultantWalker - 2 wheels          Prior Functioning/Environment              Frequency  Min 2X/week        Progress Toward Goals  OT Goals(current goals can now be found in the care plan section)  Progress towards OT goals: Progressing toward goals  Acute Rehab OT Goals Patient Stated Goal: to get better  OT Goal Formulation: With patient Time  For Goal Achievement: 06/19/17 Potential to Achieve Goals: Good  Plan Discharge plan remains appropriate    Co-evaluation    PT/OT/SLP Co-Evaluation/Treatment: Yes Reason for Co-Treatment: For patient/therapist safety PT goals addressed during session: Mobility/safety with mobility OT goals addressed during session: ADL's and self-care      AM-PAC PT "6 Clicks" Daily Activity     Outcome Measure   Help from another person eating meals?: Total Help from another person taking care of personal grooming?: A Little Help from another person toileting, which includes using toliet, bedpan, or urinal?: A Little Help from another person bathing (including washing, rinsing, drying)?: A Lot Help from another person to put on and taking off regular upper body clothing?: A Little Help from another person to put on and taking off regular lower body clothing?: A Lot 6 Click Score: 14    End of Session Equipment Utilized During Treatment: Oxygen  OT Visit Diagnosis: Unsteadiness on feet (R26.81);Muscle weakness (generalized) (M62.81)   Activity Tolerance Patient tolerated treatment well   Patient Left in chair;with call bell/phone within reach;with chair alarm set;with family/visitor present   Nurse Communication          Time: [id number] OT Time Calculation (min): 34 min  Charges: OT General Charges $OT Visit: 1 Visit OT Treatments $Self Care/Home Management : 8-22 mins  06/06/2017 Martie RoundJulie Gweneth Giles, OTR/L Pager: [phone]   Sierra PlanasMayberry, Sierra BailiffJulie Giles 06/06/2017, 12:51 PM

## 2017-06-06 NOTE — Progress Notes (Signed)
Physical Therapy Treatment Patient Details Name: Sierra HackerLamarie Zerkle MRN: [id number] DOB: 04/07/1927 Today's Date: 06/06/2017    History of Present Illness Pt is an 84 y/o female admitted secondary to SOB. CT of chest revealed multifocal consolidation consistent with pneumonia, and a pancreatic lesion. PMH includes HTN.     PT Comments    Pt is showing progress towards goals. She ambulated 75 ft in hall with min A for balance and second person with chair to follow. Pt fatigues quickly, requiring seated rest break before performing ADLs at sink. Pt would benefit from continue skilled PT to increase safety with mobility and functional independence. SNF continues to remain appropriate at this time.    Follow Up Recommendations  SNF;Supervision/Assistance - 24 hour     Equipment Recommendations  None recommended by PT    Recommendations for Other Services       Precautions / Restrictions Precautions Precautions: Fall;Other (comment) Precaution Comments: NG tube, Hard of hearing Restrictions Weight Bearing Restrictions: No    Mobility  Bed Mobility Overal bed mobility: Needs Assistance Bed Mobility: Supine to Sit     Supine to sit: Supervision     General bed mobility comments: Incerased time and effort but no physical assist required.  Transfers Overall transfer level: Needs assistance   Transfers: Sit to/from Stand;Stand Pivot Transfers Sit to Stand: Min assist;Min guard         General transfer comment: Min A for sit<>stand from bed and low BSC. Pt able to stand with min guard from recliner to sink. Posterior lean noted  Ambulation/Gait Ambulation/Gait assistance: Min assist;+2 safety/equipment Ambulation Distance (Feet): 75 Feet Assistive device: Rolling walker (2 wheeled) Gait Pattern/deviations: Staggering left;Trunk flexed;Step-through pattern;Decreased stride length;Shuffle Gait velocity: decreased Gait velocity interpretation: Below normal speed for  age/gender General Gait Details: Pt ambulated 75 ft before fatiguing and requiring seated rest break. Min A for balance. 1 LOB noted. Chair close behind. Pt staggering L.   Stairs            Wheelchair Mobility    Modified Rankin (Stroke Patients Only)       Balance Overall balance assessment: Needs assistance Sitting-balance support: Feet supported Sitting balance-Leahy Scale: Fair Sitting balance - Comments: sat EOB for ~5 min with min guard for safety. Postural control: Posterior lean Standing balance support: During functional activity;Single extremity supported Standing balance-Leahy Scale: Poor Standing balance comment: Stood at sink for ~8 min to perform ADLs. Bracing bil LEs on chair for support                            Cognition Arousal/Alertness: Awake/alert Behavior During Therapy: WFL for tasks assessed/performed Overall Cognitive Status: Within Functional Limits for tasks assessed                                 General Comments: Slightly slow processing noted but following all commands and answering questions appropriately.      Exercises      General Comments General comments (skin integrity, edema, etc.): [facility] Would likely be independent once lines are removed.      Pertinent Vitals/Pain Pain Assessment: No/denies pain    Home Living                      Prior Function  PT Goals (current goals can now be found in the care plan section) Acute Rehab PT Goals Patient Stated Goal: to get better  PT Goal Formulation: With patient Time For Goal Achievement: 06/18/17 Potential to Achieve Goals: Fair Progress towards PT goals: Progressing toward goals    Frequency    Min 2X/week      PT Plan Current plan remains appropriate    Co-evaluation PT/OT/SLP Co-Evaluation/Treatment: Yes Reason for Co-Treatment: Complexity of the patient's impairments  (multi-system involvement);For patient/therapist safety;To address functional/ADL transfers PT goals addressed during session: Mobility/safety with mobility OT goals addressed during session: ADL's and self-care      AM-PAC PT "6 Clicks" Daily Activity  Outcome Measure  Difficulty turning over in bed (including adjusting bedclothes, sheets and blankets)?: A Little Difficulty moving from lying on back to sitting on the side of the bed? : A Little Difficulty sitting down on and standing up from a chair with arms (e.g., wheelchair, bedside commode, etc,.)?: Unable Help needed moving to and from a bed to chair (including a wheelchair)?: A Little Help needed walking in hospital room?: A Little Help needed climbing 3-5 steps with a railing? : A Lot 6 Click Score: 15    End of Session Equipment Utilized During Treatment: Gait belt;Oxygen Activity Tolerance: Patient tolerated treatment well Patient left: with call bell/phone within reach;in chair;with family/visitor present Nurse Communication: Mobility status PT Visit Diagnosis: Unsteadiness on feet (R26.81);Muscle weakness (generalized) (M62.81)     Time: 1610-9604 PT Time Calculation (min) (ACUTE ONLY): 34 min  Charges:  $Gait Training: 8-22 mins                    G Codes:      Kallie Locks, [place] Pager [phone] Acute Rehab   Sheral Apley 06/06/2017, 12:25 PM

## 2017-06-06 NOTE — Progress Notes (Signed)
PROGRESS NOTE    Sierra Giles  WUJ:[id number] DOB: 1927-04-14 DOA: 05/30/2017 PCP: Cheron Schaumann., MD    Brief Narrative:  84 -year-old female who presents with cough and dyspnea. She does have the significant past medical history for generalized anxiety and hypertension. She had 2 weeks of persistent cough and dyspnea, she was diagnosed with pneumonia by her primary care provider, unfortunately unable to tolerate by mouth antibiotic therapy. On initial physical examination pressure 160/76, heart rate 92, respiratory 21, temperature 100.1, oxygen saturation 95%, ill-looking appearing, moist mucous membranes, lungs with increased work of breathing, bilateral rhonchi, positive tachypnea, heart S1-S2 present rhythmic, tachycardic, the abdomen was soft nontender, no lower extremity edema. Sodium 138, potassium 3.2, chloride 100, bicarbonate 25, BUN 31, creatinine 0.68, white count 32.0, hemoglobin 11.2, hematocrit 33.6, platelets 376. Urinalysis negative for infection. Chest x-ray with hyperinflation, right lower lobe infiltrate, CT chest with multilobar infiltrates.  Patient was admitted to the hospital working diagnosis of sepsis due to multifocal pneumonia.   Assessment & Plan:   Principal Problem:   CAP (community acquired pneumonia) Active Problems:   Acute respiratory failure with hypoxia (HCC)   Normocytic anemia   Hypokalemia   Pancreatic lesion   Anxiety   Hypertension   Protein-calorie malnutrition, severe   1. Multifocal pneumonia. Tolerating well antibiotic therapy with Zosyn IV, continue aspiration precautions. Oxymetry 97% on 2 LPM. Patient has been afebrile, wbc continue to trend down, today at 8.7. Cultures with no growth. Will plan for 8 days of therapy.   2. Swallow dysfunction. NG tube in place, patient and her family will prefer to avoid peg tube, will follow on speech evaluation and will come to a final decition. Alternative is oral feedings with maximal  aspiration precautions, taking the risk of recurrent pneumonia and aspiration.    3. Hypertension. Continue with hydralazine, and clonidine for blood pressure control.   4. Protein-calorie malnutrition. Tolerating well tube feedings, continue nutritional supplements. Patient may need SNF, or home healthy. OT consult of possible inpatient rehab.    DVT prophylaxis: enoxaparin   Code Status: full Family Communication:  I spoke with patient's son and husband at the bedside and all questions were addressed.  Disposition Plan: snf   Consultants:     Procedures:     Antimicrobials:      Subjective: Patient very weak and deconditioned, tolerating well tube feedings, no cough, no dyspnea or chest pain.   Objective: Vitals:   06/05/17 0627 06/05/17 1343 06/05/17 2035 06/06/17 0459  BP: (!) 173/65 (!) 178/68 (!) 155/58 (!) 189/62  Pulse: 84 74 76 70  Resp:  20 17 17   Temp:  98 F (36.7 C) 97.8 F (36.6 C) 97.6 F (36.4 C)  TempSrc:      SpO2:  95% 95% 98%  Weight:    43.6 kg (96 lb 1.9 oz)  Height:        Intake/Output Summary (Last 24 hours) at 06/06/2017 1258 Last data filed at 06/06/2017 0858 Gross per 24 hour  Intake 209.58 ml  Output -  Net 209.58 ml   Filed Weights   06/03/17 0600 06/04/17 0441 06/06/17 0459  Weight: 46.3 kg (102 lb 1.2 oz) 43.4 kg (95 lb 10.9 oz) 43.6 kg (96 lb 1.9 oz)    Examination:   General: Not in pain or dyspnea, deconditioned Neurology: Awake and alert, non focal. Positive generalized weakness.  E ENT: mild pallor, no icterus, oral mucosa moist. NG tube in place.  Cardiovascular: No JVD.  S1-S2 present, rhythmic, no gallops, rubs, or murmurs. No lower extremity edema. Pulmonary: decreased breath sounds bilaterally, adequate air movement, no wheezing, rhonchi or rales. Poor inspiratory effort.  Gastrointestinal. Abdomen flat, no organomegaly, non tender, no rebound or guarding Skin. No rashes Musculoskeletal: no joint  deformities     Data Reviewed: I have personally reviewed following labs and imaging studies  CBC: Recent Labs  Lab 06/02/17 0217 06/03/17 0448 06/04/17 0256 06/05/17 0209 06/06/17 0343  WBC 19.2* 17.6* 18.0* 14.5* 8.7  NEUTROABS 17.3* 16.0* 16.2* 12.7* 6.8  HGB 9.4* 9.5* 10.5* 10.1* 9.7*  HCT 29.5* 29.6* 32.6* 30.6* 31.1*  MCV 88.3 87.6 87.6 88.4 89.1  PLT 336 342 426* 457* 397   Basic Metabolic Panel: Recent Labs  Lab 05/31/17 0421 06/01/17 0153 06/02/17 0217 06/03/17 0448 06/04/17 0256 06/05/17 0209  NA 138 138 138 139 136 138  K 3.0* 3.5 3.7 3.5 3.3* 3.5  CL 102 103 104 103 99* 101  CO2 23 24 24 25 26 27   GLUCOSE 95 112* 123* 115* 129* 140*  BUN 21* 20 22* 13 10 12   CREATININE 0.63 0.55 0.49 0.55 0.54 0.54  CALCIUM 7.9* 7.8* 7.7* 7.7* 7.9* 8.0*  MG 2.5*  --   --   --  1.8  --   PHOS  --   --   --   --  2.8  --    GFR: Estimated Creatinine Clearance: 33.5 mL/min (by C-G formula based on SCr of 0.54 mg/dL). Liver Function Tests: Recent Labs  Lab 05/30/17 1642  AST 26  ALT 18  ALKPHOS 114  BILITOT 0.9  PROT 7.5  ALBUMIN 2.7*   No results for input(s): LIPASE, AMYLASE in the last 168 hours. No results for input(s): AMMONIA in the last 168 hours. Coagulation Profile: No results for input(s): INR, PROTIME in the last 168 hours. Cardiac Enzymes: No results for input(s): CKTOTAL, CKMB, CKMBINDEX, TROPONINI in the last 168 hours. BNP (last 3 results) No results for input(s): PROBNP in the last 8760 hours. HbA1C: No results for input(s): HGBA1C in the last 72 hours. CBG: Recent Labs  Lab 06/05/17 2032 06/06/17 0006 06/06/17 0500 06/06/17 0808 06/06/17 1140  GLUCAP 130* 131* 149* 135* 141*   Lipid Profile: No results for input(s): CHOL, HDL, LDLCALC, TRIG, CHOLHDL, LDLDIRECT in the last 72 hours. Thyroid Function Tests: No results for input(s): TSH, T4TOTAL, FREET4, T3FREE, THYROIDAB in the last 72 hours. Anemia Panel: No results for input(s):  VITAMINB12, FOLATE, FERRITIN, TIBC, IRON, RETICCTPCT in the last 72 hours.    Radiology Studies: I have reviewed all of the imaging during this hospital visit personally     Scheduled Meds: . aspirin EC  81 mg Oral QODAY  . cloNIDine  0.3 mg Transdermal Weekly  . enoxaparin (LOVENOX) injection  40 mg Subcutaneous Q24H  . feeding supplement (ENSURE ENLIVE)  237 mL Oral BID BM  . fluconazole  100 mg Per Tube Daily  . hydrALAZINE  100 mg Oral Q8H  . LORazepam  0.5 mg Oral QHS  . pantoprazole (PROTONIX) IV  40 mg Intravenous Q24H  . sodium chloride flush  3 mL Intravenous Q12H   Continuous Infusions: . feeding supplement (JEVITY 1.2 CAL) 1,000 mL (06/06/17 0300)  . piperacillin-tazobactam (ZOSYN)  IV Stopped (06/06/17 1027)     LOS: 7 days        Antania Hoefling Annett Gulaaniel Renesmee Raine, MD [facility] Pager [phone]

## 2017-06-07 ENCOUNTER — Inpatient Hospital Stay (HOSPITAL_COMMUNITY): Payer: Medicare Other

## 2017-06-07 LAB — GLUCOSE, CAPILLARY
GLUCOSE-CAPILLARY: 105 mg/dL — AB (ref 65–99)
GLUCOSE-CAPILLARY: 105 mg/dL — AB (ref 65–99)
GLUCOSE-CAPILLARY: 145 mg/dL — AB (ref 65–99)
GLUCOSE-CAPILLARY: 150 mg/dL — AB (ref 65–99)
Glucose-Capillary: 151 mg/dL — ABNORMAL HIGH (ref 65–99)
Glucose-Capillary: 97 mg/dL (ref 65–99)

## 2017-06-07 MED ORDER — NYSTATIN 100000 UNIT/ML MT SUSP
5.0000 mL | Freq: Four times a day (QID) | OROMUCOSAL | Status: DC
  Administered 2017-06-07 – 2017-06-10 (×13): 500000 [IU] via OROMUCOSAL
  Filled 2017-06-07 (×13): qty 5

## 2017-06-07 MED ORDER — BISACODYL 5 MG PO TBEC
10.0000 mg | DELAYED_RELEASE_TABLET | Freq: Once | ORAL | Status: AC
  Administered 2017-06-07: 10 mg via ORAL
  Filled 2017-06-07: qty 2

## 2017-06-07 MED ORDER — POLYETHYLENE GLYCOL 3350 17 G PO PACK
17.0000 g | PACK | Freq: Two times a day (BID) | ORAL | Status: DC
  Administered 2017-06-08 – 2017-06-10 (×5): 17 g via ORAL
  Filled 2017-06-07 (×5): qty 1

## 2017-06-07 NOTE — Progress Notes (Signed)
Initial Nutrition Assessment  DOCUMENTATION CODES:   Severe malnutrition in context of chronic illness, Underweight  INTERVENTION:   Magic cup TID with meals, each supplement provides 290 kcal and 9 grams of protein  If within [place], recommend restart TF:  Jevity 1.2 at 45 mL/hr (1080 mL daily)  Provides 1296 calories, 60gm protein, 835m free water  RD to continue to monitor PO intake  Recommend re-checking phosphorus and magnesium   NUTRITION DIAGNOSIS:   Severe Malnutrition related to chronic illness as evidenced by severe fat depletion, severe muscle depletion.  Ongoing  GOAL:   Patient will meet greater than or equal to 90% of their needs  Met via TF  MONITOR:   PO intake, Supplement acceptance, Labs, Weight trends, I & O's  ASSESSMENT:   84yo female admitted 2/7 with acute hypoxic respiratory failure secondary to pneumonia, hypokalemia, incidental pancreatic lesion. PMH SIADH, COPD, generalized anxiety disorder, HTN, GERD.  Discussed pt with RN. Spoke with SLP regarding pt's diet orders. Per SLP, pt at significantly high risk for aspiration of all PO consistencies.  Current order is Dysphagia 1 with Honey Thick liquids, fed by teaspoon increments. Pt will not receive enough nutrition from current diet to meet estimated nutritional needs. If within pt's [place] recommend re-starting TF. Paged MD. CJanyce Llanosremoved today (02/15).  Pt's weight trending down since last RD visit.   Labs reviewed; CBG 119-151, Hemoglobin 9.7  Medications reviewed; Dulcolax, Miralax   Diet Order:  Aspiration precautions DIET - DYS 1 Room service appropriate? Yes; Fluid consistency: Honey Thick  EDUCATION NEEDS:   No education needs have been identified at this time  Skin:  Skin Assessment: Reviewed RN Assessment  Last BM:  06/07/17  Height:   Ht Readings from Last 1 Encounters:  05/31/17 '5\' 3"'  (1.6 m)   Weight:   Wt Readings from Last 1 Encounters:  06/07/17 93 lb 14.7 oz  (42.6 kg)   Ideal Body Weight:  52.2 kg  BMI:  Body mass index is 16.64 kg/m.  Estimated Nutritional Needs:   Kcal:  1,200-1,400 kcal  Protein:  60-70 grams  Fluid:  Per MD (strict I&O)  AParks Ranger MS, RDN, LDN 06/07/2017 2:44 PM

## 2017-06-07 NOTE — Care Management Note (Addendum)
Case Management Note  Patient Details  Name: Sierra Giles MRN: [id number] Date of Birth: 03/22/1927  Subjective/Objective:                  Presented with cough and dyspnea. Sepsis 2/2 multifocal PNA. From home with husband, Les PouCarlton. Independent with ADL's PTA, no DME usage.              [address]ton Bohlen (Spouse) Bobby RumpfCraig Bisceglia (Son)    [phone]        Action/Plan: Transition to SNF vs home with home health services when medically stable. [facility]. CM continuing to follow as home health needs present    Expected Discharge Date:                  Expected Discharge Plan:  Skilled Nursing Facility  In-House Referral:  Clinical Social Work  Discharge planning Services  CM Consult  Post Acute Care Choice:    Choice offered to:     DME Arranged:    DME Agency:     HH Arranged:    HH Agency:     Status of Service:  In process, will continue to follow  If discussed at Long Length of Stay Meetings, dates discussed:    Additional Comments:  Epifanio LeschesCole, Isrrael Fluckiger Hudson, RN 06/07/2017, 10:16 AM

## 2017-06-07 NOTE — Progress Notes (Signed)
CSW spoke with patient's son, Tasia CatchingsCraig. He [facility] is an ALF and not rehab and we are unable to place patient there. CSW explained potential barriers such as bed availability.   Osborne Cascoadia Finn Amos LCSW [phone]

## 2017-06-07 NOTE — Progress Notes (Signed)
Occupational Therapy Treatment Patient Details Name: Sierra Giles MRN: [id number] DOB: 11/19/1926 Today's Date: 06/07/2017    History of present illness Pt is an 84 y/o female admitted secondary to SOB. CT of chest revealed multifocal consolidation consistent with pneumonia, and a pancreatic lesion. PMH includes HTN.    OT comments  Pt. Seen for skilled OT for continued work with grooming tasks and bed mobility.  Able to complete LB dressing with donning of socks and seated eob for combing hair.  Very motivated. Will continue to follow acutely.    Follow Up Recommendations  SNF    Equipment Recommendations  3 in 1 bedside commode    Recommendations for Other Services      Precautions / Restrictions Precautions Precautions: Fall;Other (comment) Precaution Comments: NG tube, Hard of hearing Restrictions Weight Bearing Restrictions: No       Mobility Bed Mobility Overal bed mobility: Needs Assistance Bed Mobility: Supine to Sit     Supine to sit: Supervision     General bed mobility comments: Incerased time and effort but no physical assist required.  Transfers                      Balance                                           ADL either performed or assessed with clinical judgement   ADL Overall ADL's : Needs assistance/impaired Eating/Feeding: NPO   Grooming: Brushing hair;Sitting;Minimal assistance Grooming Details (indicate cue type and reason): able to brush hair with set up for sides of hair but required assistance for back of head, good sitting balance              Lower Body Dressing: Set up;Sitting/lateral leans;Bed level Lower Body Dressing Details (indicate cue type and reason): pt. able to bend knees while lying in bed and reach b les to don socks, also seated eob able to cross each leg over knee to adjust socks                     Vision       Perception     Praxis      Cognition  Arousal/Alertness: Awake/alert Behavior During Therapy: Flat affect Overall Cognitive Status: Within Functional Limits for tasks assessed                                          Exercises     Shoulder Instructions       General Comments      Pertinent Vitals/ Pain       Pain Assessment: No/denies pain Faces Pain Scale: Hurts little more Pain Location: back Pain Descriptors / Indicators: Sore Pain Intervention(s): Monitored during session  Home Living                                          Prior Functioning/Environment              Frequency  Min 2X/week        Progress Toward Goals  OT Goals(current goals can now be found in the care plan section)  Progress towards  OT goals: Progressing toward goals     Plan Discharge plan remains appropriate    Co-evaluation                 AM-PAC PT "6 Clicks" Daily Activity     Outcome Measure   Help from another person eating meals?: Total Help from another person taking care of personal grooming?: A Little Help from another person toileting, which includes using toliet, bedpan, or urinal?: A Little Help from another person bathing (including washing, rinsing, drying)?: A Lot Help from another person to put on and taking off regular upper body clothing?: A Little Help from another person to put on and taking off regular lower body clothing?: A Lot 6 Click Score: 14    End of Session Equipment Utilized During Treatment: Oxygen  OT Visit Diagnosis: Unsteadiness on feet (R26.81);Muscle weakness (generalized) (M62.81)   Activity Tolerance Patient tolerated treatment well   Patient Left in bed;with call bell/phone within reach;with bed alarm set   Nurse Communication          Time: 1610-9604 OT Time Calculation (min): 12 min  Charges: OT General Charges $OT Visit: 1 Visit OT Treatments $Self Care/Home Management : 8-22 mins  Robet Leu,  COTA/L 06/07/2017, 12:47 PM

## 2017-06-07 NOTE — NC FL2 (Signed)
[place] MEDICAID FL2 LEVEL OF CARE SCREENING TOOL     IDENTIFICATION  Patient Name: Sierra Giles Birthdate: [birth year] Sex: female Admission Date (Current Location): 05/30/2017  [facility] and [place] Number:  Producer, television/film/video and Address:  [facility], [address] [place], [place] [address]      Provider Number: [id number]  Attending Physician Name and Address:  Coralie Keens  Relative Name and Phone Number:       Current Level of Care: Hospital Recommended Level of Care: Skilled Nursing Facility Prior Approval Number:    Date Approved/Denied:   PASRR Number: [id number]  Discharge Plan: SNF    Current Diagnoses: Patient Active Problem List   Diagnosis Date Noted  . Protein-calorie malnutrition, severe 06/03/2017  . Acute respiratory failure with hypoxia (HCC) 05/31/2017  . Normocytic anemia 05/31/2017  . Hypokalemia 05/31/2017  . Pancreatic lesion 05/31/2017  . Anxiety 05/31/2017  . Hypertension 05/31/2017  . CAP (community acquired pneumonia) 05/30/2017    Orientation RESPIRATION BLADDER Height & Weight     Self, Time, Situation, Place  O2(Nasal cannula 4L) Incontinent, External catheter Weight: 42.6 kg (93 lb 14.7 oz) Height:  5\' 3"  (160 cm)  BEHAVIORAL SYMPTOMS/MOOD NEUROLOGICAL BOWEL NUTRITION STATUS      Continent Diet(Please see DC Summary)  AMBULATORY STATUS COMMUNICATION OF NEEDS Skin   Extensive Assist Verbally Normal                       Personal Care Assistance Level of Assistance  Bathing, Feeding, Dressing Bathing Assistance: Limited assistance Feeding assistance: Limited assistance Dressing Assistance: Limited assistance     Functional Limitations Info  Hearing   Hearing Info: Impaired      SPECIAL CARE FACTORS FREQUENCY  PT (By licensed PT), OT (By licensed OT), Speech therapy     PT Frequency: 5x/week OT Frequency: 3x/week     Speech Therapy Frequency: 3x/week       Contractures      Additional Factors Info  Code Status, Allergies, Psychotropic Code Status Info: Full Allergies Info: Sulfamethoxazole-trimethoprim, Amlodipine, Hydrochlorothiazide, Nitrofuran Derivatives, Zolpidem Psychotropic Info: Ativan         Current Medications (06/07/2017):  This is the current hospital active medication list Current Facility-Administered Medications  Medication Dose Route Frequency Provider Last Rate Last Dose  . acetaminophen (TYLENOL) tablet 650 mg  650 mg Oral Q6H PRN Opyd, Lavone Neri, MD   650 mg at 06/05/17 2136   Or  . acetaminophen (TYLENOL) suppository 650 mg  650 mg Rectal Q6H PRN Opyd, Lavone Neri, MD      . aspirin EC tablet 81 mg  81 mg Oral QODAY Opyd, Lavone Neri, MD   81 mg at 06/06/17 1037  . bisacodyl (DULCOLAX) EC tablet 5 mg  5 mg Oral Daily PRN Opyd, Lavone Neri, MD   5 mg at 06/05/17 0508  . cloNIDine (CATAPRES - Dosed in mg/24 hr) patch 0.3 mg  0.3 mg Transdermal Weekly Briant Cedar, MD   0.3 mg at 06/02/17 1504  . enoxaparin (LOVENOX) injection 40 mg  40 mg Subcutaneous Q24H Opyd, Lavone Neri, MD   40 mg at 06/07/17 1438  . hydrALAZINE (APRESOLINE) injection 10 mg  10 mg Intravenous Q4H PRN Briant Cedar, MD   10 mg at 06/03/17 1511  . hydrALAZINE (APRESOLINE) tablet 100 mg  100 mg Oral Q8H Briant Cedar, MD   100 mg at 06/07/17 1438  . ipratropium-albuterol (DUONEB)  0.5-2.5 (3) MG/3ML nebulizer solution 3 mL  3 mL Nebulization Q4H PRN Briant CedarEzenduka, Nkeiruka J, MD      . lip balm (BLISTEX) ointment   Topical PRN Blake DivineParkey, Shannon, RPH      . LORazepam (ATIVAN) tablet 0.5 mg  0.5 mg Oral QHS Briant CedarEzenduka, Nkeiruka J, MD   0.5 mg at 06/06/17 2306  . magic mouthwash w/lidocaine  5 mL Oral QID PRN Opyd, Lavone Neriimothy S, MD   5 mL at 06/01/17 1510  . nystatin (MYCOSTATIN) 100000 UNIT/ML suspension 500,000 Units  5 mL Mouth/Throat QID Coralie KeensArrien, Mauricio Daniel, MD   500,000 Units at 06/07/17 1449  . ondansetron (ZOFRAN) tablet 4 mg  4 mg Oral  Q6H PRN Opyd, Lavone Neriimothy S, MD       Or  . ondansetron (ZOFRAN) injection 4 mg  4 mg Intravenous Q6H PRN Opyd, Lavone Neriimothy S, MD      . phenol (CHLORASEPTIC) mouth spray 2 spray  2 spray Mouth/Throat PRN Briant CedarEzenduka, Nkeiruka J, MD      . polyethylene glycol (MIRALAX / GLYCOLAX) packet 17 g  17 g Oral BID Arrien, York RamMauricio Daniel, MD      . promethazine-codeine (PHENERGAN with CODEINE) 6.25-10 MG/5ML syrup 5 mL  5 mL Oral Q6H PRN Opyd, Lavone Neriimothy S, MD      . sodium chloride flush (NS) 0.9 % injection 3 mL  3 mL Intravenous Q12H Opyd, Lavone Neriimothy S, MD   3 mL at 06/06/17 0000  . traZODone (DESYREL) tablet 50 mg  50 mg Oral QHS PRN Opyd, Lavone Neriimothy S, MD         Discharge Medications: Please see discharge summary for a list of discharge medications.  Relevant Imaging Results:  Relevant Lab Results:   Additional Information SSN: [id number]  [name] S WestlandRayyan, [place]CSWA

## 2017-06-07 NOTE — Progress Notes (Signed)
PROGRESS NOTE    Sierra Giles  ONG:[id number] DOB: 08/07/1926 DOA: 05/30/2017 PCP: Cheron Schaumann., MD    Brief Narrative:  84 -year-old female who presents with cough and dyspnea. She does have the significant past medical history for generalized anxiety and hypertension. She had 2 weeks of persistent cough and dyspnea, she was diagnosed with pneumonia by her primary care provider, unfortunately unable to tolerate by mouth antibiotic therapy. On initial physical examination pressure 160/76, heart rate 92, respiratory 21, temperature 100.1, oxygen saturation 95%, ill-looking appearing, moist mucous membranes, lungs with increased work of breathing, bilateral rhonchi, positive tachypnea, heart S1-S2 present rhythmic, tachycardic, the abdomen was soft nontender, no lower extremity edema. Sodium 138, potassium 3.2, chloride 100, bicarbonate 25, BUN 31, creatinine 0.68, white count 32.0, hemoglobin 11.2, hematocrit 33.6, platelets 376. Urinalysis negative for infection. Chest x-ray with hyperinflation, right lower lobe infiltrate, CT chest with multilobar infiltrates.  Patient was admitted to the hospital working diagnosis of sepsis due to multifocal pneumonia.   Assessment & Plan:   Principal Problem:   CAP (community acquired pneumonia) Active Problems:   Acute respiratory failure with hypoxia (HCC)   Normocytic anemia   Hypokalemia   Pancreatic lesion   Anxiety   Hypertension   Protein-calorie malnutrition, severe   1. Multifocal pneumonia. Today completed antibiotic therapy, patient has been afebrile, no significant dyspnea, continue aspiration precautions, oxymetry monitoring and as needed 02 per [place] to target 02 saturation greater than 92%.  2. Swallow dysfunction, plus oral thrush. Repeat swallow evaluation with still high risk for aspiration, will try pureed with honey thick liquids per speech therapy. Patient and her family prefer to avoid peg tube. Will discontinue  fluconazole and will exchange to nystatin.    3. Hypertension. On hydralazine, and clonidine with good blood pressure control.    4. Protein-calorie malnutrition. Will continue nutritional support, aspiration precautions.   DVT prophylaxis:enoxaparin Code Status:full Family Communication:I spoke with patient's son over the phone and all questions were addressed. Disposition Plan:snf   Consultants:    Procedures:    Antimicrobials:   Subjective: Patient not feeling well, very weak and deconditioned, no nausea or vomiting, tolerating po well, has not had a bowel movement recently.   Objective: Vitals:   06/06/17 2138 06/06/17 2143 06/07/17 0540 06/07/17 0826  BP: (!) 182/68 (!) 182/68 (!) 156/64 (!) 146/58  Pulse: 73 73 82 78  Resp: (!) 28 (!) 28 16 18   Temp: 97.7 F (36.5 C) 97.7 F (36.5 C) 98.1 F (36.7 C)   TempSrc: Oral Axillary Oral   SpO2: 95% 95% 96%   Weight:   42.6 kg (93 lb 14.7 oz)   Height:        Intake/Output Summary (Last 24 hours) at 06/07/2017 1030 Last data filed at 06/07/2017 0941 Gross per 24 hour  Intake 940 ml  Output 700 ml  Net 240 ml   Filed Weights   06/04/17 0441 06/06/17 0459 06/07/17 0540  Weight: 43.4 kg (95 lb 10.9 oz) 43.6 kg (96 lb 1.9 oz) 42.6 kg (93 lb 14.7 oz)    Examination:   General: Not in pain or dyspnea, deconditioned Neurology: Awake and alert, non focal. Oral thrush E ENT: positive pallor, no icterus, oral mucosa moist Cardiovascular: No JVD. S1-S2 present, rhythmic, no gallops, rubs, or murmurs. No lower extremity edema. Pulmonary: vesicular breath sounds bilaterally, adequate air movement, no wheezing, rhonchi or rales. Decreased breath sounds at bases, poor inspiratory effort.  Gastrointestinal. Abdomen distended and tympanic, no  organomegaly, non tender, no rebound or guarding Skin. No rashes Musculoskeletal: no joint deformities     Data Reviewed: I have personally reviewed following  labs and imaging studies  CBC: Recent Labs  Lab 06/02/17 0217 06/03/17 0448 06/04/17 0256 06/05/17 0209 06/06/17 0343  WBC 19.2* 17.6* 18.0* 14.5* 8.7  NEUTROABS 17.3* 16.0* 16.2* 12.7* 6.8  HGB 9.4* 9.5* 10.5* 10.1* 9.7*  HCT 29.5* 29.6* 32.6* 30.6* 31.1*  MCV 88.3 87.6 87.6 88.4 89.1  PLT 336 342 426* 457* 397   Basic Metabolic Panel: Recent Labs  Lab 06/01/17 0153 06/02/17 0217 06/03/17 0448 06/04/17 0256 06/05/17 0209  NA 138 138 139 136 138  K 3.5 3.7 3.5 3.3* 3.5  CL 103 104 103 99* 101  CO2 24 24 25 26 27   GLUCOSE 112* 123* 115* 129* 140*  BUN 20 22* 13 10 12   CREATININE 0.55 0.49 0.55 0.54 0.54  CALCIUM 7.8* 7.7* 7.7* 7.9* 8.0*  MG  --   --   --  1.8  --   PHOS  --   --   --  2.8  --    GFR: Estimated Creatinine Clearance: 32.7 mL/min (by C-G formula based on SCr of 0.54 mg/dL). Liver Function Tests: No results for input(s): AST, ALT, ALKPHOS, BILITOT, PROT, ALBUMIN in the last 168 hours. No results for input(s): LIPASE, AMYLASE in the last 168 hours. No results for input(s): AMMONIA in the last 168 hours. Coagulation Profile: No results for input(s): INR, PROTIME in the last 168 hours. Cardiac Enzymes: No results for input(s): CKTOTAL, CKMB, CKMBINDEX, TROPONINI in the last 168 hours. BNP (last 3 results) No results for input(s): PROBNP in the last 8760 hours. HbA1C: No results for input(s): HGBA1C in the last 72 hours. CBG: Recent Labs  Lab 06/06/17 1657 06/06/17 1953 06/06/17 2353 06/07/17 0409 06/07/17 0738  GLUCAP 142* 119* 136* 151* 145*   Lipid Profile: No results for input(s): CHOL, HDL, LDLCALC, TRIG, CHOLHDL, LDLDIRECT in the last 72 hours. Thyroid Function Tests: No results for input(s): TSH, T4TOTAL, FREET4, T3FREE, THYROIDAB in the last 72 hours. Anemia Panel: No results for input(s): VITAMINB12, FOLATE, FERRITIN, TIBC, IRON, RETICCTPCT in the last 72 hours.    Radiology Studies: I have reviewed all of the imaging during this  hospital visit personally     Scheduled Meds: . aspirin EC  81 mg Oral QODAY  . cloNIDine  0.3 mg Transdermal Weekly  . enoxaparin (LOVENOX) injection  40 mg Subcutaneous Q24H  . feeding supplement (ENSURE ENLIVE)  237 mL Oral BID BM  . fluconazole  100 mg Per Tube Daily  . hydrALAZINE  100 mg Oral Q8H  . LORazepam  0.5 mg Oral QHS  . pantoprazole (PROTONIX) IV  40 mg Intravenous Q24H  . sodium chloride flush  3 mL Intravenous Q12H   Continuous Infusions: . feeding supplement (JEVITY 1.2 CAL) 1,000 mL (06/06/17 1857)  . piperacillin-tazobactam (ZOSYN)  IV Stopped (06/07/17 [id number])     LOS: 8 days        Lamira Borin Annett Gulaaniel Rawad Bochicchio, MD [facility] Pager [phone]

## 2017-06-07 NOTE — Plan of Care (Signed)
  Progressing Activity: Risk for activity intolerance will decrease 06/07/2017 0234 - Progressing by Burtis Junesrewery, Nakayla Rorabaugh, RN Note Patient able to turn self using side rails when doing peri care Encouraged patient to move self in bed every couple of hours refused pillow support at this time  Nutrition: Adequate nutrition will be maintained 06/07/2017 0234 - Progressing by Burtis Junesrewery, Chay Mazzoni, RN Note Continues to receive Jevity via cortrak nasal gastric tube, aspiration precautions in place

## 2017-06-07 NOTE — Consult Note (Signed)
Modified Barium Swallow Progress Note  Patient Details  Name: Sierra Giles MRN: [id number] Date of Birth: 12/17/1926  Today's Date: 06/07/2017  Modified Barium Swallow completed.  Full report located under Chart Review in the Imaging Section.  Brief recommendations include the following:  Clinical Impression Pt presents with functional oral stage for consistencies tested (nectar, honey, and puree). Pt continues to exhibit severe pharyngeal dysphagia, characterized by delayed swallow reflex across consistencies, decreased hyolaryngeal movement, severely reduced base of tongue retraction and epiglottic movement, decreased laryngeal vestibule closure and decreased pharyngeal contraction.  Cricopharyngeal opening was reduced as well. These deficits led to pharyngeal residue in the vallecular and pyriform sinuses. Thin liquids were not given during this assessment, due to high aspiration risk.   Aspiration with an immediate but weak, ineffective cough response was seen before the swallow on nectar thick liquids. Penetration of honey thick liquids was seen after the swallow via cup sip, but not via teaspoon presentation.  Penetration was noted after the swallow of puree, and exhibited multiple dry swallows to clear residue.   Recent MBS recommended NPO due to high aspiration risk. MBS has been repeated today to facilitate decision making.  Pt is a significantly high risk for aspiration of all po consistencies. Her spontaneous and volitional cough are not an effective means to clear penetrate/aspirate.  If within pt/family wishes, recommend allowing the patient to have ice chips following aggressive oral care, and puree diet and honey thick liquids via 1/2 tsp boluses. It is highly likely that pt will NOT meet her nutrition and hydration needs po, and she is at very high risk for aspiration with any po intake.  ST will follow up for tolerance of po, and education to mitigate aspiration risk.     Swallow Evaluation Recommendations  SLP Diet Recommendations: Dysphagia 1 (Puree) solids;Honey thick liquids   Liquid Administration via: Spoon   Medication Administration: Crushed with puree   Supervision: Patient able to self feed;Staff to assist with self feeding;Full supervision/cueing for compensatory strategies   Compensations: Minimize environmental distractions;Slow rate;Small sips/bites;Multiple dry swallows after each bite/sip;Clear throat intermittently   Postural Changes: Remain semi-upright after after feeds/meals (Comment);Seated upright at 90 degrees   Oral Care Recommendations: Oral care QID   Other Recommendations: Remove water pitcher;Have oral suction available   Sierra Giles, [facility] Treatment FacilityMSP, CCC-SLP Speech Language Pathologist [phone]  Sierra Giles, Sierra Brown 06/07/2017,1:11 PM

## 2017-06-08 DIAGNOSIS — R1310 Dysphagia, unspecified: Secondary | ICD-10-CM

## 2017-06-08 LAB — GLUCOSE, CAPILLARY
GLUCOSE-CAPILLARY: 89 mg/dL (ref 65–99)
GLUCOSE-CAPILLARY: 92 mg/dL (ref 65–99)
Glucose-Capillary: 102 mg/dL — ABNORMAL HIGH (ref 65–99)
Glucose-Capillary: 125 mg/dL — ABNORMAL HIGH (ref 65–99)
Glucose-Capillary: 132 mg/dL — ABNORMAL HIGH (ref 65–99)

## 2017-06-08 LAB — BASIC METABOLIC PANEL
Anion gap: 10 (ref 5–15)
BUN: 19 mg/dL (ref 6–20)
CALCIUM: 8.2 mg/dL — AB (ref 8.9–10.3)
CO2: 26 mmol/L (ref 22–32)
CREATININE: 0.48 mg/dL (ref 0.44–1.00)
Chloride: 107 mmol/L (ref 101–111)
GFR calc non Af Amer: >60 mL/min (ref >60)
Glucose, Bld: 99 mg/dL (ref 65–99)
Potassium: 3.7 mmol/L (ref 3.5–5.1)
SODIUM: 143 mmol/L (ref 135–145)

## 2017-06-08 MED ORDER — LORAZEPAM 0.5 MG PO TABS
0.5000 mg | ORAL_TABLET | Freq: Two times a day (BID) | ORAL | Status: DC | PRN
  Administered 2017-06-08: 0.5 mg via ORAL
  Filled 2017-06-08: qty 1

## 2017-06-08 MED ORDER — ENSURE ENLIVE PO LIQD
237.0000 mL | Freq: Three times a day (TID) | ORAL | Status: DC
  Administered 2017-06-08 – 2017-06-10 (×7): 237 mL via ORAL

## 2017-06-08 MED ORDER — RESOURCE THICKENUP CLEAR PO POWD
ORAL | Status: DC | PRN
  Filled 2017-06-08: qty 125

## 2017-06-08 MED ORDER — SALINE SPRAY 0.65 % NA SOLN
1.0000 | NASAL | Status: DC | PRN
  Administered 2017-06-08: 1 via NASAL
  Filled 2017-06-08: qty 44

## 2017-06-08 NOTE — Progress Notes (Signed)
PROGRESS NOTE    Sierra Giles  ZOX:[id number] DOB: 09/16/1926 DOA: 05/30/2017 PCP: Cheron SchaumannVelazquez, Sierra Y., MD    Brief Narrative:  84 -year-old female who presents with cough and dyspnea. She does have the significant past medical history for generalized anxiety and hypertension. She had 2 weeks of persistent cough and dyspnea, she was diagnosed with pneumonia by her primary care provider, unfortunately unable to tolerate by mouth antibiotic therapy. On initial physical examination pressure 160/76, heart rate 92, respiratory 21, temperature 100.1, oxygen saturation 95%, ill-looking appearing, moist mucous membranes, lungs with increased work of breathing, bilateral rhonchi, positive tachypnea, heart S1-S2 present rhythmic, tachycardic, the abdomen was soft nontender, no lower extremity edema. Sodium 138, potassium 3.2, chloride 100, bicarbonate 25, BUN 31, creatinine 0.68, white count 32.0, hemoglobin 11.2, hematocrit 33.6, platelets 376. Urinalysis negative for infection. Chest x-ray with hyperinflation, right lower lobe infiltrate, CT chest with multilobar infiltrates.  Patient was admitted to the hospital working diagnosis of sepsis due to multifocal pneumonia.   Assessment & Plan:   Principal Problem:   CAP (community acquired pneumonia) Active Problems:   Acute respiratory failure with hypoxia (HCC)   Normocytic anemia   Hypokalemia   Pancreatic lesion   Anxiety   Hypertension   Protein-calorie malnutrition, severe  1. Multifocal pneumonia.Contine to be afebrile, no significant dyspnea but very deconditioned, aspiration precautions, oxymetry monitoring with 02 sat 95 % on 3 LPM. Will assess room air oxymetry before discharge.   2. Swallow dysfunction, plus oral thrush.Patient tolerating well pureed diet, continue to be high risk for aspiration, patient and her family aware. Will continue careful feedings, will add nutritional supplements, follow up nutrition recommendations.  Patient with very poor oral intake. Improved thrush, will continue with nystatin.   3. Hypertension.Continuehydralazine, and clonidine.    4. Protein-calorie malnutrition.Nutritional support, aspiration precautions. Poor oral intake due to generalized deconditioning. Poor prognosis.   DVT prophylaxis:enoxaparin Code Status:full Family Communication:I spoke with patient'shusband  all questions were addressed. Disposition Plan:snf/home with HH   Consultants:    Procedures:    Antimicrobials:   Subjective: Patient has been tolerating po well, no dyspnea or chest pain. Still very weak and deconditioned.   Objective: Vitals:   06/07/17 1123 06/07/17 1341 06/07/17 2134 06/08/17 0534  BP:  (!) 174/68 (!) 186/64 (!) 167/71  Pulse:  73 64 78  Resp:  20 (!) 24 16  Temp: 98.3 F (36.8 C) 98.1 F (36.7 C) (!) 97.5 F (36.4 C) 98.3 F (36.8 C)  TempSrc: Axillary Oral Oral Oral  SpO2: 93% 98% 96% 99%  Weight:    42.1 kg (92 lb 13 oz)  Height:        Intake/Output Summary (Last 24 hours) at 06/08/2017 1040 Last data filed at 06/08/2017 0911 Gross per 24 hour  Intake 100 ml  Output 450 ml  Net -350 ml   Filed Weights   06/06/17 0459 06/07/17 0540 06/08/17 0534  Weight: 43.6 kg (96 lb 1.9 oz) 42.6 kg (93 lb 14.7 oz) 42.1 kg (92 lb 13 oz)    Examination:   General: Not in pain or dyspnea. deconditioned Neurology: Awake and alert, non focal  E ENT: mild pallor, no icterus, oral mucosa moist Cardiovascular: No JVD. S1-S2 present, rhythmic, no gallops, rubs, or murmurs. No lower extremity edema. Pulmonary: decreased breath sounds bilaterally, adequate air movement, no wheezing, rhonchi or rales. Gastrointestinal. Abdomen flat, no organomegaly, non tender, no rebound or guarding Skin. No rashes Musculoskeletal: no joint deformities  Data Reviewed: I have personally reviewed following labs and imaging studies  CBC: Recent Labs  Lab  06/02/17 0217 06/03/17 0448 06/04/17 0256 06/05/17 0209 06/06/17 0343  WBC 19.2* 17.6* 18.0* 14.5* 8.7  NEUTROABS 17.3* 16.0* 16.2* 12.7* 6.8  HGB 9.4* 9.5* 10.5* 10.1* 9.7*  HCT 29.5* 29.6* 32.6* 30.6* 31.1*  MCV 88.3 87.6 87.6 88.4 89.1  PLT 336 342 426* 457* 397   Basic Metabolic Panel: Recent Labs  Lab 06/02/17 0217 06/03/17 0448 06/04/17 0256 06/05/17 0209 06/08/17 0341  NA 138 139 136 138 143  K 3.7 3.5 3.3* 3.5 3.7  CL 104 103 99* 101 107  CO2 24 25 26 27 26   GLUCOSE 123* 115* 129* 140* 99  BUN 22* 13 10 12 19   CREATININE 0.49 0.55 0.54 0.54 0.48  CALCIUM 7.7* 7.7* 7.9* 8.0* 8.2*  MG  --   --  1.8  --   --   PHOS  --   --  2.8  --   --    GFR: Estimated Creatinine Clearance: 32.3 mL/min (by C-G formula based on SCr of 0.48 mg/dL). Liver Function Tests: No results for input(s): AST, ALT, ALKPHOS, BILITOT, PROT, ALBUMIN in the last 168 hours. No results for input(s): LIPASE, AMYLASE in the last 168 hours. No results for input(s): AMMONIA in the last 168 hours. Coagulation Profile: No results for input(s): INR, PROTIME in the last 168 hours. Cardiac Enzymes: No results for input(s): CKTOTAL, CKMB, CKMBINDEX, TROPONINI in the last 168 hours. BNP (last 3 results) No results for input(s): PROBNP in the last 8760 hours. HbA1C: No results for input(s): HGBA1C in the last 72 hours. CBG: Recent Labs  Lab 06/07/17 1726 06/07/17 2128 06/07/17 2346 06/08/17 0358 06/08/17 0809  GLUCAP 97 105* 105* 89 92   Lipid Profile: No results for input(s): CHOL, HDL, LDLCALC, TRIG, CHOLHDL, LDLDIRECT in the last 72 hours. Thyroid Function Tests: No results for input(s): TSH, T4TOTAL, FREET4, T3FREE, THYROIDAB in the last 72 hours. Anemia Panel: No results for input(s): VITAMINB12, FOLATE, FERRITIN, TIBC, IRON, RETICCTPCT in the last 72 hours.    Radiology Studies: I have reviewed all of the imaging during this hospital visit personally     Scheduled Meds: .  aspirin EC  81 mg Oral QODAY  . cloNIDine  0.3 mg Transdermal Weekly  . enoxaparin (LOVENOX) injection  40 mg Subcutaneous Q24H  . hydrALAZINE  100 mg Oral Q8H  . LORazepam  0.5 mg Oral QHS  . nystatin  5 mL Mouth/Throat QID  . polyethylene glycol  17 g Oral BID  . sodium chloride flush  3 mL Intravenous Q12H   Continuous Infusions:   LOS: 9 days        Sierra Giles Annett Gula, MD [facility] Pager [phone]

## 2017-06-08 NOTE — Progress Notes (Signed)
  Speech Language Pathology Treatment: Dysphagia  Patient Details Name: Sierra Giles MRN: [id number] DOB: 10/28/1926 Today's Date: 06/08/2017 Time: [id number] SLP Time Calculation (min) (ACUTE ONLY): 24 min  Assessment / Plan / Recommendation Clinical Impression  Pt seen for follow-up for education, therapeutic exercise and reinforcement of safe swallow precautions due to high aspiration risk. Pt's granddaughter present; reports her grandmother has just been completing her exercises and has questions re: fatigue. Pt reported difficulty completing multiple effortful swallows. SLP educated re: completing repetitions until point of fatigue. SLP encouraged pt to take breaks from exercise prior to meals to reduce fatigue when eating. Pt declined PO trials, so SLP provided education using MBS video for teachback re: rationale for swallowing precautions and swallowing exercises. Encouraged frequent oral care to reduce bacterial load to minimize aspiration risk. Pt completed 10 repetitions of chin tuck against resistance exercise with rare min A. Will continue to follow for education, interventions to improve swallow function; pt remains at high risk for aspiration. Continue dys 1, honey thick liquids by teaspoon.     HPI HPI: Sierra CongerLamarie Clinardis a 84 y.o.femalewith medical history significant forgeneralized anxiety disorder and hypertension, now presenting to the emergency department for evaluation of cough, shortness of breath, and outpatient chest x-ray suggestive of pneumonia. Patient reports that she had been in her usual state until approximately 2 weeks ago when she noted the insidious development of cough and dyspnea. Symptoms have worsened significantly over the past several days, she saw her PCP yesterday for these complaints, was diagnosed with pneumonia, and started on an antibiotic. She reports a sore throat that has made it difficult to tolerate the oral antibiotic. She reports that rapid  strep and influenza PCR were negative at her PCPs clinic. She denies fevers, chills, chest pain, or leg swelling or tenderness. Reports that her cough is nonproductive.  CHest CT is showing multifocal consolidation in bilateral lungs with adjacent ground glass opacity.        SLP Plan  Continue with current plan of care       Recommendations  Diet recommendations: Dysphagia 1 (puree);Honey-thick liquid Liquids provided via: Teaspoon Medication Administration: Crushed with puree Supervision: Full supervision/cueing for compensatory strategies Compensations: Minimize environmental distractions;Slow rate;Small sips/bites;Multiple dry swallows after each bite/sip;Clear throat intermittently Postural Changes and/or Swallow Maneuvers: Seated upright 90 degrees                Oral Care Recommendations: Oral care QID Follow up Recommendations: Skilled Nursing facility;24 hour supervision/assistance SLP Visit Diagnosis: Dysphagia, oropharyngeal phase (R13.12) Plan: Continue with current plan of care       GO              Rondel BatonMary Beth Lexianna Weinrich, MS, CCC-SLP Speech-Language Pathologist [phone]  Arlana LindauMary E Eilah Common 06/08/2017, 5:13 PM

## 2017-06-09 LAB — GLUCOSE, CAPILLARY
GLUCOSE-CAPILLARY: 153 mg/dL — AB (ref 65–99)
Glucose-Capillary: 110 mg/dL — ABNORMAL HIGH (ref 65–99)
Glucose-Capillary: 108 mg/dL — ABNORMAL HIGH (ref 65–99)
Glucose-Capillary: 133 mg/dL — ABNORMAL HIGH (ref 65–99)
Glucose-Capillary: 181 mg/dL — ABNORMAL HIGH (ref 65–99)
Glucose-Capillary: 132 mg/dL — ABNORMAL HIGH (ref 65–99)

## 2017-06-09 MED ORDER — HYDRALAZINE HCL 20 MG/ML IJ SOLN
10.0000 mg | Freq: Once | INTRAMUSCULAR | Status: AC
  Administered 2017-06-09: 10 mg via INTRAVENOUS
  Filled 2017-06-09: qty 1

## 2017-06-09 MED ORDER — FLUTICASONE PROPIONATE 50 MCG/ACT NA SUSP
1.0000 | Freq: Every day | NASAL | Status: DC
Start: 2017-06-09 — End: 2017-06-10
  Administered 2017-06-09 – 2017-06-10 (×2): 1 via NASAL
  Filled 2017-06-09: qty 16

## 2017-06-09 MED ORDER — ACETAMINOPHEN 325 MG PO TABS: 650.0000 mg | ORAL_TABLET | Freq: Four times a day (QID) | ORAL | Status: DC | PRN

## 2017-06-09 MED ORDER — SALINE SPRAY 0.65 % NA SOLN
1.0000 | Freq: Four times a day (QID) | NASAL | Status: DC
  Administered 2017-06-09 – 2017-06-10 (×5): 1 via NASAL

## 2017-06-09 NOTE — Progress Notes (Signed)
Notified by nurse tech of BP 191/55.  Paged provider C. Bodenheimer, who ordered hydralazine 10mg  IV push.  Will continue to monitor.

## 2017-06-09 NOTE — Progress Notes (Signed)
PROGRESS NOTE    Sierra Giles  ZOX:[id number] DOB: 04/30/1926 DOA: 05/30/2017 PCP: Cheron SchaumannVelazquez, Gretchen Y., MD    Brief Narrative:  84 -year-old female who presents with cough and dyspnea. She does have the significant past medical history for generalized anxiety and hypertension. She had 2 weeks of persistent cough and dyspnea, she was diagnosed with pneumonia by her primary care provider, unfortunately unable to tolerate by mouth antibiotic therapy. On initial physical examination pressure 160/76, heart rate 92, respiratory 21, temperature 100.1, oxygen saturation 95%, ill-looking appearing, moist mucous membranes, lungs with increased work of breathing, bilateral rhonchi, positive tachypnea, heart S1-S2 present rhythmic, tachycardic, the abdomen was soft nontender, no lower extremity edema. Sodium 138, potassium 3.2, chloride 100, bicarbonate 25, BUN 31, creatinine 0.68, white count 32.0, hemoglobin 11.2, hematocrit 33.6, platelets 376. Urinalysis negative for infection. Chest x-ray with hyperinflation, right lower lobe infiltrate, CT chest with multilobar infiltrates.  Patient was admitted to the hospital working diagnosis of sepsis due to multifocal pneumonia.   Assessment & Plan:   Principal Problem:   CAP (community acquired pneumonia) Active Problems:   Acute respiratory failure with hypoxia (HCC)   Normocytic anemia   Hypokalemia   Pancreatic lesion   Anxiety   Hypertension   Protein-calorie malnutrition, severe    1. Multilobar aspiration pneumonia.Completed antibiotic therapy, very deconditioned,continue with aspiration precautions, oxymetry monitoring with 02 sat 99 to 99 % on 3 LPM.   2. Swallow dysfunction, plus oral thrush.Continue aspiration precautions, nystatin. Nutritional supplements. Poor oral intake.   3. Hypertension.On hydralazine, and clonidine, systolic 167 to 186.   4. Protein-calorie malnutrition.Continue nutritional support, patient continue to  be high risk for aspiration. Will plan to discharge to snf in the next 24 hours, to continue reconditioning. Patient is very weak.   5. Sinus congestion. Likely due to NG tube. Will change saline nasal spry to qid and will add flonase.   DVT prophylaxis:enoxaparin Code Status:full Family Communication:I spoke with patient'shusband  all questions were addressed. Disposition Plan:snf/home with HH   Consultants:    Procedures:    Antimicrobials  Subjective: Patient feeling very deconditioned, positive back pain, moderate in intensity. Tolerating by mouth feedings. Complained of sinus congestion.   Objective: Vitals:   06/08/17 1604 06/08/17 2056 06/09/17 0501 06/09/17 0602  BP: (!) 141/54 (!) 159/56 (!) (P) 191/55 (!) 162/61  Pulse: 79 71 64   Resp: 18 17 17    Temp: (!) 97.4 F (36.3 C) 97.6 F (36.4 C) (!) 97.4 F (36.3 C)   TempSrc:      SpO2: 95% 99% 96%   Weight:   (P) 42 kg (92 lb 9.5 oz)   Height:        Intake/Output Summary (Last 24 hours) at 06/09/2017 1000 Last data filed at 06/08/2017 2144 Gross per 24 hour  Intake 3 ml  Output 175 ml  Net -172 ml   Filed Weights   06/07/17 0540 06/08/17 0534 06/09/17 0501  Weight: 42.6 kg (93 lb 14.7 oz) 42.1 kg (92 lb 13 oz) (P) 42 kg (92 lb 9.5 oz)    Examination:   General: Not in pain or dyspnea, deconditioned Neurology: Awake and alert, non focal  E ENT: mild pallor, no icterus, oral mucosa moist Cardiovascular: No JVD. S1-S2 present, rhythmic, no gallops, rubs, or murmurs. No lower extremity edema. Pulmonary: decreased breath sounds bilaterally at bases due to poor inspiratory effort, adequate air movement, no wheezing, rhonchi or rales. Gastrointestinal. Abdomen flat, no organomegaly, non tender, no rebound  or guarding Skin. No rashes Musculoskeletal: no joint deformities     Data Reviewed: I have personally reviewed following labs and imaging studies  CBC: Recent Labs  Lab  06/03/17 0448 06/04/17 0256 06/05/17 0209 06/06/17 0343  WBC 17.6* 18.0* 14.5* 8.7  NEUTROABS 16.0* 16.2* 12.7* 6.8  HGB 9.5* 10.5* 10.1* 9.7*  HCT 29.6* 32.6* 30.6* 31.1*  MCV 87.6 87.6 88.4 89.1  PLT 342 426* 457* 397   Basic Metabolic Panel: Recent Labs  Lab 06/03/17 0448 06/04/17 0256 06/05/17 0209 06/08/17 0341  NA 139 136 138 143  K 3.5 3.3* 3.5 3.7  CL 103 99* 101 107  CO2 25 26 27 26   GLUCOSE 115* 129* 140* 99  BUN 13 10 12 19   CREATININE 0.55 0.54 0.54 0.48  CALCIUM 7.7* 7.9* 8.0* 8.2*  MG  --  1.8  --   --   PHOS  --  2.8  --   --    GFR: Estimated Creatinine Clearance: 32.3 mL/min (by C-G formula based on SCr of 0.48 mg/dL). Liver Function Tests: No results for input(s): AST, ALT, ALKPHOS, BILITOT, PROT, ALBUMIN in the last 168 hours. No results for input(s): LIPASE, AMYLASE in the last 168 hours. No results for input(s): AMMONIA in the last 168 hours. Coagulation Profile: No results for input(s): INR, PROTIME in the last 168 hours. Cardiac Enzymes: No results for input(s): CKTOTAL, CKMB, CKMBINDEX, TROPONINI in the last 168 hours. BNP (last 3 results) No results for input(s): PROBNP in the last 8760 hours. HbA1C: No results for input(s): HGBA1C in the last 72 hours. CBG: Recent Labs  Lab 06/08/17 1749 06/08/17 2054 06/09/17 0013 06/09/17 0458 06/09/17 0809  GLUCAP 125* 132* 133* 110* 108*   Lipid Profile: No results for input(s): CHOL, HDL, LDLCALC, TRIG, CHOLHDL, LDLDIRECT in the last 72 hours. Thyroid Function Tests: No results for input(s): TSH, T4TOTAL, FREET4, T3FREE, THYROIDAB in the last 72 hours. Anemia Panel: No results for input(s): VITAMINB12, FOLATE, FERRITIN, TIBC, IRON, RETICCTPCT in the last 72 hours.    Radiology Studies: I have reviewed all of the imaging during this hospital visit personally     Scheduled Meds: . aspirin EC  81 mg Oral QODAY  . cloNIDine  0.3 mg Transdermal Weekly  . enoxaparin (LOVENOX) injection   40 mg Subcutaneous Q24H  . feeding supplement (ENSURE ENLIVE)  237 mL Oral TID BM  . hydrALAZINE  100 mg Oral Q8H  . nystatin  5 mL Mouth/Throat QID  . polyethylene glycol  17 g Oral BID  . sodium chloride flush  3 mL Intravenous Q12H   Continuous Infusions:   LOS: 10 days        Hiroshi Krummel Annett Gula, MD [facility] Pager [phone]

## 2017-06-10 LAB — GLUCOSE, CAPILLARY
GLUCOSE-CAPILLARY: 126 mg/dL — AB (ref 65–99)
Glucose-Capillary: 100 mg/dL — ABNORMAL HIGH (ref 65–99)
Glucose-Capillary: 118 mg/dL — ABNORMAL HIGH (ref 65–99)

## 2017-06-10 MED ORDER — CLONIDINE 0.3 MG/24HR TD PTWK: 0.3000 mg | MEDICATED_PATCH | TRANSDERMAL | 0 refills | Status: AC

## 2017-06-10 MED ORDER — ENSURE ENLIVE PO LIQD: 237.0000 mL | Freq: Three times a day (TID) | ORAL | 0 refills | Status: AC

## 2017-06-10 MED ORDER — ACETAMINOPHEN 325 MG PO TABS: 650.0000 mg | ORAL_TABLET | Freq: Four times a day (QID) | ORAL | 0 refills | Status: AC | PRN

## 2017-06-10 MED ORDER — SALINE SPRAY 0.65 % NA SOLN: 1.0000 | NASAL | 0 refills | Status: AC | PRN

## 2017-06-10 MED ORDER — NYSTATIN 100000 UNIT/ML MT SUSP: 5.0000 mL | Freq: Four times a day (QID) | OROMUCOSAL | 0 refills | Status: AC

## 2017-06-10 MED ORDER — BISACODYL 5 MG PO TBEC: 5.0000 mg | DELAYED_RELEASE_TABLET | Freq: Every day | ORAL | 0 refills | Status: AC | PRN

## 2017-06-10 MED ORDER — LORAZEPAM 0.5 MG PO TABS: 0.5000 mg | ORAL_TABLET | Freq: Two times a day (BID) | ORAL | 0 refills | Status: AC | PRN

## 2017-06-10 NOTE — Progress Notes (Signed)
Patient will DC to: [facility] Anticipated DC date: 06/10/17 Family notified: Spouse and son Transport by: Sharin MonsPTAR    Per MD patient ready for DC to [facility]. RN, patient, patient's family, and facility notified of DC. Discharge Summary sent to facility. RN given number for report [phone]). DC packet on chart. Ambulance transport requested for patient.   CSW signing off.  Cristobal GoldmannNadia Devonda Pequignot, Connecticut[place]CSWA Clinical Social Worker [phone]

## 2017-06-10 NOTE — Clinical Social Work Placement (Signed)
   CLINICAL SOCIAL WORK PLACEMENT  NOTE  Date:  06/10/2017  Patient Details  Name: Sierra Giles MRN: [id number] Date of Birth: 02/18/1927  Clinical Social Work is seeking post-discharge placement for this patient at the Skilled  Nursing Facility level of care (*CSW will initial, date and re-position this form in  chart as items are completed):  Yes   Patient/family provided with [facility] Clinical Social Work Department's list of facilities offering this level of care within the geographic area requested by the patient (or if unable, by the patient's family).  Yes   Patient/family informed of their freedom to choose among providers that offer the needed level of care, that participate in Medicare, Medicaid or managed care program needed by the patient, have an available bed and are willing to accept the patient.  Yes   Patient/family informed of [facility]'s ownership interest in [facility] and [facility], as well as of the fact that they are under no obligation to receive care at these facilities.  PASRR submitted to EDS on 06/05/17     PASRR number received on 06/05/17     Existing PASRR number confirmed on       FL2 transmitted to all facilities in geographic area requested by pt/family on 06/05/17     FL2 transmitted to all facilities within larger geographic area on       Patient informed that his/her managed care company has contracts with or will negotiate with certain facilities, including the following:        Yes   Patient/family informed of bed offers received.  [facility]     Physician recommends and patient chooses bed at      [facility].  Patient to be transferred to facility by PTAR     Patient family notified on 06/10/17 of transfer.  Name of family member notified:  Spouse and son Tasia CatchingsCraig     PHYSICIAN       Additional Comment:     _______________________________________________ Mearl LatinNadia S Candice Tobey, LCSWA 06/10/2017, 2:05 PM

## 2017-06-10 NOTE — Discharge Summary (Addendum)
Physician Discharge Summary  Lane HackerLamarie Camilli UJW:[id number]:[id number] DOB: 02/23/1927 DOA: 05/30/2017  PCP: Cheron SchaumannVelazquez, Gretchen Y., MD  Admit date: 05/30/2017 Discharge date: 06/10/2017  Admitted From: Home Disposition:  snf  Recommendations for Outpatient Follow-up and new medication changes:  1. Follow up with PCP in 1- weeks 2. Patient is very high risk for aspiration, please continue aspiration precautions 3. Code status will be addressed by family in the near future.   Home Health: na  Equipment/Devices: na   Discharge Condition: stable CODE STATUS: full  Diet recommendation: Heart healthy   Diet recommendations: Dysphagia 1 (puree);Honey-thick liquid Liquids provided via: Teaspoon Medication Administration: Crushed with puree Supervision: Full supervision/cueing for compensatory strategies Compensations: Minimize environmental distractions;Slow rate;Small sips/bites;Multiple dry swallows after each bite/sip;Clear throat intermittently Postural Changes and/or Swallow Maneuvers: Seated upright 90 degrees       Brief/Interim Summary: 84 -year-old female who presents with cough and dyspnea. She does have the significant past medical history for generalized anxiety and hypertension. She had 2 weeks of persistent cough and dyspnea, she was diagnosed with pneumonia by her primary care provider, unfortunately unable to tolerate by mouth antibiotic therapy. On initial physical examination pressure 160/76, heart rate 92, respiratory rate 21, temperature 100.1, oxygen saturation 95%, ill-looking appearing, moist mucous membranes, lungs with increased work of breathing, bilateral rhonchi, positive tachypnea, heart S1-S2 present rhythmic, tachycardic, the abdomen was soft nontender, no lower extremity edema. Sodium 138, potassium 3.2, chloride 100, bicarbonate 25, BUN 31, creatinine 0.68, white count 32.0, hemoglobin 11.2, hematocrit 33.6, platelets 376. Urinalysis negative for infection. Chest x-ray with  hyperinflation, right lower lobe infiltrate, CT chest with multilobar infiltrates.  Patient was admitted to the hospital working diagnosis of sepsis due to multifocal aspiration pneumonia.   1.  Sepsis due to multilobar aspiration pneumonia, complicated by acute hypoxic respiratory failure. Patient was admitted to the medical ward, placed on a remote telemetry monitor, IV fluids and IV broad spectrum antibiotic therapy, with Zosyn. She completed antibiotic therapy in the hospital. She has responded well to the medical therapy. Oximetry at discharge is 95% on 2.5 L nasal cannula. Further workup revealed severe aspiration, she was kept initially nothing by mouth and receive speech therapy. Patient has developed diarrhea, likely related to broad-spectrum antibiotics, will need close follow-up as an outpatient.   2. Swallow dysfunction plus oral thrush. Patient was found to have severe aspiration, she required tube feeds per NG tube, follow-up swallow study show some improvement but still high risk of aspiration. This was discussed with the patient and her family, decision was made to remove NG tube, and continue feedings by mouth with full aspiration precautions. Please keep head of bed elevated 45 at all times. Further speech therapy at the nursing facility. At this point PEG tube has been declined. Patient was placed on nystatin with improvement of thrush, she'll continue therapy for the next 14 days.   3. Severe protein calorie malnutrition with severe deconditioning. Patient with very poor oral intake, nutritional services were consulted, patient was placed on nutritional supplements, patient will continue physical therapy at the skilled nursing facility.  4. Hypertension. Patient had episode of uncontrolled hypertension, she received IV hydralazine as needed, she was continued on oral hydralazine, and was exchanged to a patch to improve compliance.  5. Sinus congestion/ sinusitis. Likely related to  NG tube, patient was placed on nasal sprays including saline and corticosteroids. NG tube was removed. Acute sinusitis due to NG tube, not present on admission.     Discharge  Diagnoses:  Principal Problem:   CAP (community acquired pneumonia) Active Problems:   Acute respiratory failure with hypoxia (HCC)   Normocytic anemia   Hypokalemia   Pancreatic lesion   Anxiety   Hypertension   Protein-calorie malnutrition, severe    Discharge Instructions   Allergies as of 06/10/2017      Reactions   Sulfamethoxazole-trimethoprim Hives   Amlodipine Swelling   Hydrochlorothiazide Other (See Comments)   Hyponatremia   Nitrofuran Derivatives Other (See Comments)   Unknown reaction   Zolpidem Nausea And Vomiting, Other (See Comments)   Confusion      Medication List    STOP taking these medications   cefdinir 300 MG capsule Commonly known as:  OMNICEF   cloNIDine 0.3 MG tablet Commonly known as:  CATAPRES   furosemide 20 MG tablet Commonly known as:  LASIX   potassium chloride SA 20 MEQ tablet Commonly known as:  K-DUR,KLOR-CON     TAKE these medications   acetaminophen 325 MG tablet Commonly known as:  TYLENOL Take 2 tablets (650 mg total) by mouth every 6 (six) hours as needed (back pain.).   aspirin 81 MG tablet Take 81 mg by mouth every other day.   bisacodyl 5 MG EC tablet Commonly known as:  DULCOLAX Take 1 tablet (5 mg total) by mouth daily as needed for moderate constipation.   cloNIDine 0.3 mg/24hr patch Commonly known as:  CATAPRES - Dosed in mg/24 hr Place 1 patch (0.3 mg total) onto the skin once a week. Start taking on:  06/16/2017   feeding supplement (ENSURE ENLIVE) Liqd Take 237 mLs by mouth 3 (three) times daily between meals.   hydrALAZINE 100 MG tablet Commonly known as:  APRESOLINE Take 100 mg by mouth 3 (three) times daily.   latanoprost 0.005 % ophthalmic solution Commonly known as:  XALATAN Place 1 drop into both eyes daily.    loratadine 10 MG tablet Commonly known as:  CLARITIN Take 10 mg by mouth daily.   LORazepam 0.5 MG tablet Commonly known as:  ATIVAN Take 1 tablet (0.5 mg total) by mouth 2 (two) times daily as needed for anxiety or sleep.   nystatin 100000 UNIT/ML suspension Commonly known as:  MYCOSTATIN Use as directed 5 mLs (500,000 Units total) in the mouth or throat 4 (four) times daily for 14 days.   polyethylene glycol packet Commonly known as:  MIRALAX / GLYCOLAX Take 17 g by mouth daily.   sodium chloride 0.65 % Soln nasal spray Commonly known as:  OCEAN Place 1 spray into both nostrils as needed for congestion.       Allergies  Allergen Reactions  . Sulfamethoxazole-Trimethoprim Hives  . Amlodipine Swelling  . Hydrochlorothiazide Other (See Comments)    Hyponatremia   . Nitrofuran Derivatives Other (See Comments)    Unknown reaction  . Zolpidem Nausea And Vomiting and Other (See Comments)    Confusion     Consultations:     Procedures/Studies: Dg Chest 2 View  Result Date: 05/30/2017 CLINICAL DATA:  Cough for 2 weeks, pneumonia EXAM: CHEST  2 VIEW COMPARISON:  05/29/2017 FINDINGS: Normal heart size, mediastinal contours, and pulmonary vascularity. Perihilar infiltrates bilaterally increased in RIGHT lower lobe increased since 05/29/2017. Question developing cavitation in the RIGHT upper lobe infiltrate seen previously adjacent to the minor fissure. Underlying emphysematous and bronchitic changes. No pleural effusion or pneumothorax. Atherosclerotic calcifications aorta. Bones demineralized. IMPRESSION: COPD changes with increased BILATERAL pulmonary infiltrates since previous exam. Question developing cavitation in the previously  seen RIGHT upper lobe infiltrate adjacent to the minor fissure; this could be better assessed by CT. Electronically Signed   By: Ulyses Southward M.D.   On: 05/30/2017 17:40   Ct Chest W Contrast  Result Date: 05/30/2017 CLINICAL DATA:  Abnormal chest  x-ray EXAM: CT CHEST WITH CONTRAST TECHNIQUE: Multidetector CT imaging of the chest was performed during intravenous contrast administration. CONTRAST:  ISOVUE-300 IOPAMIDOL (ISOVUE-300) INJECTION 61% COMPARISON:  Chest x-ray 05/30/2017, CT chest 09/27/2015 FINDINGS: Cardiovascular: Nonaneurysmal aorta. Moderate aortic atherosclerosis. Coronary artery calcification. Mild cardiomegaly. No significant pericardial effusion. Mediastinum/Nodes: Multiple nodules in the left lobe of thyroid measuring up to 16 mm. Midline trachea. Multiple enlarged mediastinal lymph nodes, for example precarinal lymph node measures 11 mm. Subcarinal lymph node measures 12 mm. Esophagus within normal limits. Lungs/Pleura: Trace pleural effusions multifocal slightly nodular appearing areas of consolidation and adjacent ground-glass density in the upper lobes, right middle lobe, and bilateral lower lobes, likely reflecting multifocal pneumonia. No definite cavitary lesions are visualized. Upper Abdomen: No acute abnormality 3.9 cm cystic lesion in the region of the pancreatic neck and proximal body with possible additional cystic lesion at the tail of the pancreas. Musculoskeletal: Kyphosis of the spine. Mild to moderate compression fracture of T7 with about 30% loss of height anteriorly. Mild to moderate compression fracture of L1 with a about 25% loss of height anteriorly, both fractures are new since prior CT from 2017. IMPRESSION: 1. Multifocal consolidations within the bilateral lungs with adjacent ground-glass density, suspicious for multifocal pneumonia. No definite cavitary lesions. Trace pleural effusions. Short interval CT follow-up in 6-12 weeks recommended following adequate antibiotic trial to ensure resolution of the consolidations and exclude underlying mass. 2. Mild mediastinal adenopathy, likely reactive 3. Suspected cystic lesions within the proximal pancreas and pancreatic tail. When the patient is clinically stable  and able to follow directions and hold their breath (preferably as an outpatient) further evaluation with dedicated abdominal MRI should be considered. 4. Mild to moderate compression fractures of T7 and L1, new since 2017 comparison CT Aortic Atherosclerosis (ICD10-I70.0). Electronically Signed   By: Jasmine Pang M.D.   On: 05/30/2017 19:59   Dg Chest Port 1 View  Result Date: 06/03/2017 CLINICAL DATA:  Sore throat with weakness and dyspnea.  Pneumonia. EXAM: PORTABLE CHEST 1 VIEW COMPARISON:  Radiographs and CT 05/30/2017. FINDINGS: 0631 hr. The heart size and mediastinal contours are stable. There is aortic atherosclerosis. Multifocal airspace opacities bilaterally have not significantly changed. There is no pneumothorax or significant pleural effusion. No acute osseous findings are seen. Evidence of a chronic rotator cuff tear on the right noted. IMPRESSION: No significant change in multifocal airspace opacities consistent with pneumonia. Electronically Signed   By: Carey Bullocks M.D.   On: 06/03/2017 07:39   Dg Swallowing Func-speech Pathology  Result Date: 06/07/2017 Objective Swallowing Evaluation: Type of Study: MBS-Modified Barium Swallow Study  Patient Details Name: Luca Burston MRN: [id number] Date of Birth: [birth year] Today's Date: 06/07/2017 Time: SLP Start Time (ACUTE ONLY): 1000 -SLP Stop Time (ACUTE ONLY): 1030 SLP Time Calculation (min) (ACUTE ONLY): 30 min Past Medical History: Past Medical History: Diagnosis Date . Anxiety  . GERD (gastroesophageal reflux disease)  . Hypercholesterolemia  . Hypertension  . Hyponatremia  . UTI (lower urinary tract infection)  Past Surgical History: No past surgical history on file. HPI: Floris Clinardis a 84 y.o.femalewith medical history significant forgeneralized anxiety disorder and hypertension, now presenting to the emergency department for evaluation of cough, shortness of breath,  and outpatient chest x-ray suggestive of pneumonia. Patient  reports that she had been in her usual state until approximately 2 weeks ago when she noted the insidious development of cough and dyspnea. Symptoms have worsened significantly over the past several days, she saw her PCP yesterday for these complaints, was diagnosed with pneumonia, and started on an antibiotic. She reports a sore throat that has made it difficult to tolerate the oral antibiotic. She reports that rapid strep and influenza PCR were negative at her PCPs clinic. She denies fevers, chills, chest pain, or leg swelling or tenderness. Reports that her cough is nonproductive.  CHest CT is showing multifocal consolidation in bilateral lungs with adjacent ground glass opacity.   Subjective: Pt seen in radiology for MBS Assessment / Plan / Recommendation CHL IP CLINICAL IMPRESSIONS 06/07/2017 Clinical Impression Pt presents with functional oral stage for consistencies tested. Severe pharyngeal dysphagia, characterized as follows: Pt exhibits delayed swallow reflex across consistencies, decreased hyolaryngeal movement, severely reduced base of tongue retraction and epiglottic movement, decreased laryngeal vestibule closure and decreased pharyngeal contraction.  Cricopharyngeal opening was reduced as well. These deficits led to pharyngeal residue in the vallecular and pyriform sinuses. Thin liquids were not given during this assessment, due to high aspiration risk. Aspiration with an immediate but weak, ineffective cough response was seen before the swallow on nectar thick liquids. Penetration of honey thick liquids was seen after the swallow via cup sip, but not via teaspoon presentation. Penetration was noted after the swallow of puree, and exhibited multiple dry swallows to clear residue. Recent MBS recommended  NPO due to high aspiration risk. MBS has been repeated today to facilitate decision making.  Pt is a significantly high risk for aspiration of all po consistencies. Her spontaneous and volitional  cough are not an effective means to clear penetrate/aspirate. If within pt/family wishes, recommend allowing the patient to have ice chips following aggressive oral care, and puree diet and honey thick liquids via 1/2 tsp boluses. It is highly likely that pt will NOT meet her nutrition and hydration needs po, and she is at very high risk for aspiration with any po intake.  ST will follow up for tolerance of po, and education to mitigate aspiration risk.  SLP Visit Diagnosis Dysphagia, oropharyngeal phase (R13.12)   Impact on safety and function Severe aspiration risk Risk for inadequate nutrition/hydration   CHL IP TREATMENT RECOMMENDATION 06/07/2017 Treatment Recommendations Therapy as outlined in treatment plan below   Prognosis 06/07/2017 Prognosis for Safe Diet Advancement Fair CHL IP DIET RECOMMENDATION 06/07/2017 SLP Diet Recommendations Dysphagia 1 (Puree) solids;Honey thick liquids Liquid Administration via Spoon Medication Administration Crushed with puree Compensations Minimize environmental distractions;Slow rate;Small sips/bites;Multiple dry swallows after each bite/sip;Clear throat intermittently Postural Changes Seated upright at 90 degrees Remain semi-upright after after feeds/meals    CHL IP OTHER RECOMMENDATIONS 06/07/2017 Oral Care Recommendations Oral care QID Other Recommendations Remove water pitcher;Have oral suction available   CHL IP FOLLOW UP RECOMMENDATIONS 06/07/2017 Follow up Recommendations Skilled Nursing facility;24 hour supervision/assistance   CHL IP FREQUENCY AND DURATION 06/02/2017 Speech Therapy Frequency (ACUTE ONLY) min 2x/week Treatment Duration 2 weeks      CHL IP ORAL PHASE 06/07/2017 Oral Phase WFL for limited consistencies tested  CHL IP PHARYNGEAL PHASE 06/07/2017 Pharyngeal Phase Impaired Pharyngeal- Honey Teaspoon Delayed swallow initiation-pyriform sinuses;Reduced pharyngeal peristalsis;Reduced epiglottic inversion;Reduced anterior laryngeal mobility;Reduced laryngeal  elevation;Reduced airway/laryngeal closure;Reduced tongue base retraction;Pharyngeal residue - valleculae;Pharyngeal residue - pyriform Pharyngeal Material does not enter airway Pharyngeal- Honey Cup Delayed  swallow initiation-pyriform sinuses;Reduced pharyngeal peristalsis;Reduced epiglottic inversion;Reduced anterior laryngeal mobility;Reduced laryngeal elevation;Reduced airway/laryngeal closure;Reduced tongue base retraction;Penetration/Apiration after swallow;Pharyngeal residue - valleculae;Pharyngeal residue - pyriform Pharyngeal Material enters airway, CONTACTS cords and not ejected out Pharyngeal- Nectar Teaspoon Delayed swallow initiation-pyriform sinuses;Reduced pharyngeal peristalsis;Reduced epiglottic inversion;Reduced anterior laryngeal mobility;Reduced laryngeal elevation;Reduced airway/laryngeal closure;Reduced tongue base retraction;Penetration/Aspiration before swallow;Trace aspiration Pharyngeal Material enters airway, passes BELOW cords and not ejected out despite cough attempt by patient Pharyngeal- Puree Delayed swallow initiation-vallecula;Reduced pharyngeal peristalsis;Reduced epiglottic inversion;Reduced anterior laryngeal mobility;Reduced laryngeal elevation;Reduced airway/laryngeal closure;Reduced tongue base retraction;Penetration/Apiration after swallow;Pharyngeal residue - valleculae;Pharyngeal residue - pyriform Pharyngeal Material enters airway, CONTACTS cords and then ejected out  CHL IP CERVICAL ESOPHAGEAL PHASE 06/07/2017 Cervical Esophageal Phase Impaired Puree Reduced cricopharyngeal relaxation Celia B. Murvin [facility], CCC-SLP Speech Language Pathologist [phone] Leigh Aurora 06/07/2017, 12:48 PM              Dg Swallowing Func-speech Pathology  Result Date: 06/02/2017 Objective Swallowing Evaluation: Type of Study: MBS-Modified Barium Swallow Study  Patient Details Name: Rupinder Livingston MRN: [id number] Date of Birth: 02-Jul-1926 Today's Date: 06/02/2017 Time: SLP Start Time  (ACUTE ONLY): 1044 -SLP Stop Time (ACUTE ONLY): 1115 SLP Time Calculation (min) (ACUTE ONLY): 31 min Past Medical History: Past Medical History: Diagnosis Date . Anxiety  . GERD (gastroesophageal reflux disease)  . Hypercholesterolemia  . Hypertension  . Hyponatremia  . UTI (lower urinary tract infection)  Past Surgical History: No past surgical history on file. HPI: Emelly Clinardis a 84 y.o.femalewith medical history significant forgeneralized anxiety disorder and hypertension, now presenting to the emergency department for evaluation of cough, shortness of breath, and outpatient chest x-ray suggestive of pneumonia. Patient reports that she had been in her usual state until approximately 2 weeks ago when she noted the insidious development of cough and dyspnea. Symptoms have worsened significantly over the past several days, she saw her PCP yesterday for these complaints, was diagnosed with pneumonia, and started on an antibiotic. She reports a sore throat that has made it difficult to tolerate the oral antibiotic. She reports that rapid strep and influenza PCR were negative at her PCPs clinic. She denies fevers, chills, chest pain, or leg swelling or tenderness. Reports that her cough is nonproductive.  CHest CT is showing multifocal consolidation in bilateral lungs with adjacent ground glass opacity.   Subjective: The patient was seen in radiology for MBS to determine current swallowing physiology.  Assessment / Plan / Recommendation CHL IP CLINICAL IMPRESSIONS 06/02/2017 Clinical Impression MBS was completed using thin liquids via spoon and pureed material.  Per family there were no obvious issues swallowing until the last few weeks and it was felt to be related to her severe sore throat that is most likely related to thrush.  The patient presented with a moderate oral and severe pharyngeal dysphagia.  The oral phase was marked by decreased bolus control and lingual movement leading to premature  spillage of the bolus and oral residue.  The pharyngeal phase was marked by a delayed swallow trigger, decreased hyolaryngeal movement, severely reduced base of tongue retraction and epiglottic movement, decreased laryngeal vestibule closure and decreased pharyngeal contraction.  In addition, cricopharyngeal opening was reduced.  These deficits led to pharyngeal residue in the vallecular, pyriform sinuses and on the posterior pharyngeal wall.  In addition, aspiration with a weak cough response was seen prior to and after the swallow given spoon sips of thin liquids.  Aspiration with a weak cough response was seen during and after the swallow  given pureed material.  The patient's spontaneous and volitional cough are not an effective means to clear the material from the airway.  Safest diet recommendation at this time is NPO.  Given that the patient was not symptomatic for swallowing issues until recently am hopeful issues will be short term, however, it could be more of a long term issue.   Recommend allowing the patient to have ice chips with aggressive oral care.  ST will follow up for swallowing therapy and readiness for PO intake.  Suspect she will need repeat MBS as her current medical condition and stamina improve.   SLP Visit Diagnosis Dysphagia, oropharyngeal phase (R13.12) Attention and concentration deficit following -- Frontal lobe and executive function deficit following -- Impact on safety and function Severe aspiration risk   CHL IP TREATMENT RECOMMENDATION 06/02/2017 Treatment Recommendations Therapy as outlined in treatment plan below   Prognosis 06/02/2017 Prognosis for Safe Diet Advancement Fair Barriers to Reach Goals -- Barriers/Prognosis Comment -- CHL IP DIET RECOMMENDATION 06/02/2017 SLP Diet Recommendations NPO;Ice chips PRN after oral care;Alternative means - temporary Liquid Administration via -- Medication Administration Via alternative means Compensations -- Postural Changes --   CHL IP OTHER  RECOMMENDATIONS 06/02/2017 Recommended Consults -- Oral Care Recommendations Oral care QID Other Recommendations --   CHL IP FOLLOW UP RECOMMENDATIONS 06/02/2017 Follow up Recommendations Other (comment)   CHL IP FREQUENCY AND DURATION 06/02/2017 Speech Therapy Frequency (ACUTE ONLY) min 2x/week Treatment Duration 2 weeks      CHL IP ORAL PHASE 06/02/2017 Oral Phase Impaired Oral - Pudding Teaspoon -- Oral - Pudding Cup -- Oral - Honey Teaspoon -- Oral - Honey Cup -- Oral - Nectar Teaspoon -- Oral - Nectar Cup -- Oral - Nectar Straw -- Oral - Thin Teaspoon Weak lingual manipulation;Reduced posterior propulsion;Premature spillage;Decreased bolus cohesion;Delayed oral transit Oral - Thin Cup -- Oral - Thin Straw -- Oral - Puree Weak lingual manipulation;Reduced posterior propulsion;Delayed oral transit;Decreased bolus cohesion;Premature spillage Oral - Mech Soft -- Oral - Regular -- Oral - Multi-Consistency -- Oral - Pill -- Oral Phase - Comment --  CHL IP PHARYNGEAL PHASE 06/02/2017 Pharyngeal Phase Impaired Pharyngeal- Pudding Teaspoon -- Pharyngeal -- Pharyngeal- Pudding Cup -- Pharyngeal -- Pharyngeal- Honey Teaspoon -- Pharyngeal -- Pharyngeal- Honey Cup -- Pharyngeal -- Pharyngeal- Nectar Teaspoon -- Pharyngeal -- Pharyngeal- Nectar Cup -- Pharyngeal -- Pharyngeal- Nectar Straw -- Pharyngeal -- Pharyngeal- Thin Teaspoon Delayed swallow initiation-pyriform sinuses;Reduced pharyngeal peristalsis;Reduced epiglottic inversion;Reduced anterior laryngeal mobility;Reduced laryngeal elevation;Reduced airway/laryngeal closure;Reduced tongue base retraction;Penetration/Aspiration before swallow;Penetration/Apiration after swallow;Moderate aspiration;Pharyngeal residue - valleculae;Pharyngeal residue - pyriform Pharyngeal Material enters airway, passes BELOW cords and not ejected out despite cough attempt by patient Pharyngeal- Thin Cup -- Pharyngeal -- Pharyngeal- Thin Straw -- Pharyngeal -- Pharyngeal- Puree Delayed swallow  initiation-vallecula;Reduced pharyngeal peristalsis;Reduced epiglottic inversion;Reduced anterior laryngeal mobility;Reduced laryngeal elevation;Reduced airway/laryngeal closure;Reduced tongue base retraction;Penetration/Aspiration during swallow;Penetration/Apiration after swallow;Moderate aspiration;Pharyngeal residue - valleculae;Pharyngeal residue - pyriform Pharyngeal Material enters airway, passes BELOW cords and not ejected out despite cough attempt by patient Pharyngeal- Mechanical Soft -- Pharyngeal -- Pharyngeal- Regular -- Pharyngeal -- Pharyngeal- Multi-consistency -- Pharyngeal -- Pharyngeal- Pill -- Pharyngeal -- Pharyngeal Comment --  CHL IP CERVICAL ESOPHAGEAL PHASE 06/02/2017 Cervical Esophageal Phase Impaired Pudding Teaspoon -- Pudding Cup -- Honey Teaspoon -- Honey Cup -- Nectar Teaspoon -- Nectar Cup -- Nectar Straw -- Thin Teaspoon Reduced cricopharyngeal relaxation Thin Cup -- Thin Straw -- Puree Reduced cricopharyngeal relaxation Mechanical Soft -- Regular -- Multi-consistency -- Pill -- Cervical Esophageal Comment --  Dimas Aguas, MA, CCC-SLP Acute Rehab SLP [phone]' Fleet Contras 06/02/2017, 11:54 AM                  Subjective: Patient feeling better, tolerating po well, no nausea or vomiting, positive back pain. Patient very weak and deconditioned.   Discharge Exam: Vitals:   06/09/17 2349 06/10/17 0611  BP: (!) 164/55 (!) 159/68  Pulse: 79 91  Resp: 19 19  Temp: (!) 97.5 F (36.4 C) 97.7 F (36.5 C)  SpO2: 98% 95%   Vitals:   06/09/17 1421 06/09/17 1524 06/09/17 2349 06/10/17 0611  BP: (!) 157/67 (!) 145/50 (!) 164/55 (!) 159/68  Pulse:  75 79 91  Resp:  20 19 19   Temp:  97.7 F (36.5 C) (!) 97.5 F (36.4 C) 97.7 F (36.5 C)  TempSrc:  Oral Axillary Oral  SpO2:  97% 98% 95%  Weight:      Height:        General: deconditioned Neurology: Awake and alert, non focal  E ENT: mild pallor, no icterus, oral mucosa moist Cardiovascular: No JVD. S1-S2  present, rhythmic, no gallops, rubs, or murmurs. No lower extremity edema. Pulmonary: decreased breath sounds bilaterally at bases, adequate air movement, no wheezing, rhonchi or rales. Poor inspiratory effort.  Gastrointestinal. Abdomen flat, no organomegaly, non tender, no rebound or guarding Skin. No rashes Musculoskeletal: no joint deformities   The results of significant diagnostics from this hospitalization (including imaging, microbiology, ancillary and laboratory) are listed below for reference.     Microbiology: Recent Results (from the past 240 hour(s))  Culture, Urine     Status: Abnormal   Collection Time: 05/31/17  6:42 PM  Result Value Ref Range Status   Specimen Description URINE, CLEAN CATCH  Final   Special Requests NONE  Final   Culture (A)  Final    <10,000 COLONIES/mL INSIGNIFICANT GROWTH Performed at [facility], [address], [place], [place] [address]    Report Status 06/01/2017 FINAL  Final     Labs: BNP (last 3 results) No results for input(s): BNP in the last 8760 hours. Basic Metabolic Panel: Recent Labs  Lab 06/04/17 0256 06/05/17 0209 06/08/17 0341  NA 136 138 143  K 3.3* 3.5 3.7  CL 99* 101 107  CO2 26 27 26   GLUCOSE 129* 140* 99  BUN 10 12 19   CREATININE 0.54 0.54 0.48  CALCIUM 7.9* 8.0* 8.2*  MG 1.8  --   --   PHOS 2.8  --   --    Liver Function Tests: No results for input(s): AST, ALT, ALKPHOS, BILITOT, PROT, ALBUMIN in the last 168 hours. No results for input(s): LIPASE, AMYLASE in the last 168 hours. No results for input(s): AMMONIA in the last 168 hours. CBC: Recent Labs  Lab 06/04/17 0256 06/05/17 0209 06/06/17 0343  WBC 18.0* 14.5* 8.7  NEUTROABS 16.2* 12.7* 6.8  HGB 10.5* 10.1* 9.7*  HCT 32.6* 30.6* 31.1*  MCV 87.6 88.4 89.1  PLT 426* 457* 397   Cardiac Enzymes: No results for input(s): CKTOTAL, CKMB, CKMBINDEX, TROPONINI in the last 168 hours. BNP: Invalid input(s): POCBNP CBG: Recent Labs  Lab  06/09/17 1235 06/09/17 1753 06/09/17 2314 06/10/17 0620 06/10/17 0756  GLUCAP 181* 132* 153* 100* 118*   D-Dimer No results for input(s): DDIMER in the last 72 hours. Hgb A1c No results for input(s): HGBA1C in the last 72 hours. Lipid Profile No results for input(s): CHOL, HDL, LDLCALC, TRIG, CHOLHDL, LDLDIRECT in  the last 72 hours. Thyroid function studies No results for input(s): TSH, T4TOTAL, T3FREE, THYROIDAB in the last 72 hours.  Invalid input(s): FREET3 Anemia work up No results for input(s): VITAMINB12, FOLATE, FERRITIN, TIBC, IRON, RETICCTPCT in the last 72 hours. Urinalysis    Component Value Date/Time   COLORURINE YELLOW 05/30/2017 1818   APPEARANCEUR CLOUDY (A) 05/30/2017 1818   LABSPEC 1.015 05/30/2017 1818   PHURINE 6.0 05/30/2017 1818   GLUCOSEU NEGATIVE 05/30/2017 1818   HGBUR NEGATIVE 05/30/2017 1818   BILIRUBINUR SMALL (A) 05/30/2017 1818   KETONESUR 15 (A) 05/30/2017 1818   PROTEINUR 30 (A) 05/30/2017 1818   UROBILINOGEN 0.2 07/14/2014 2012   NITRITE NEGATIVE 05/30/2017 1818   LEUKOCYTESUR TRACE (A) 05/30/2017 1818   Sepsis Labs Invalid input(s): PROCALCITONIN,  WBC,  LACTICIDVEN Microbiology Recent Results (from the past 240 hour(s))  Culture, Urine     Status: Abnormal   Collection Time: 05/31/17  6:42 PM  Result Value Ref Range Status   Specimen Description URINE, CLEAN CATCH  Final   Special Requests NONE  Final   Culture (A)  Final    <10,000 COLONIES/mL INSIGNIFICANT GROWTH Performed at [facility], [address], [place], [place] [address]    Report Status 06/01/2017 FINAL  Final     Time coordinating discharge: 45 minutes  SIGNED:   Coralie Keens, MD  [facility] 06/10/2017, 10:59 AM Pager [phone]  If 7PM-7AM, please contact night-coverage [email or web address] Password [email or web address]

## 2017-06-10 NOTE — Care Management Note (Signed)
Case Management Note  Patient Details  Name: Sierra HackerLamarie Strnad MRN: [id number] Date of Birth: 02/07/1927  Subjective/Objective:    Presented with cough and dyspnea. Sepsis 2/2 multifocal PNA. From home with husband, [place]ou[place].              [address] Rom[address] (Spouse) Bobby RumpfCraig Solarz (Son)    [phone]     PCP: Doreatha MartinGretchen Velazquez  Action/Plan: Transition to SNF today. CSW managing disposition to SNF. No needs present needs identified per NCM.  Expected Discharge Date:    06/10/2017           Expected Discharge Plan:  Skilled Nursing Facility  In-House Referral:  Clinical Social Work  Discharge planning Services  CM Consult   Status of Service:  Completed, signed off  If discussed at [employer] Length of Stay Meetings, dates discussed:    Additional Comments:  Epifanio LeschesCole, Lidiya Reise Hudson, RN 06/10/2017, 10:48 AM

## 2017-06-10 NOTE — Progress Notes (Signed)
Pt prepared for d/c to SNF. IV d/c'd. Skin intact except as charted in most recent assessments. Vitals are stable. Report called to receiving facility. Pt to be transported by ambulance service. 

## 2017-07-08 ENCOUNTER — Other Ambulatory Visit (HOSPITAL_COMMUNITY): Payer: Self-pay | Admitting: Internal Medicine

## 2017-07-08 DIAGNOSIS — R1310 Dysphagia, unspecified: Secondary | ICD-10-CM

## 2017-07-18 ENCOUNTER — Ambulatory Visit (HOSPITAL_COMMUNITY)
Admission: RE | Admit: 2017-07-18 | Discharge: 2017-07-18 | Disposition: A | Discharge status: Home / Self Care | Payer: No Typology Code available for payment source | Source: Ambulatory Visit | Attending: Internal Medicine | Admitting: Internal Medicine

## 2017-07-18 DIAGNOSIS — R1312 Dysphagia, oropharyngeal phase: Secondary | ICD-10-CM | POA: Insufficient documentation

## 2017-07-18 DIAGNOSIS — R1310 Dysphagia, unspecified: Secondary | ICD-10-CM

## 2017-07-18 NOTE — Progress Notes (Signed)
Modified Barium Swallow Progress Note  Patient Details  Name: Sierra Giles MRN: [id number] Date of Birth: 08/21/1926  Today's Date: 07/18/2017  Modified Barium Swallow completed.  Full report located under Chart Review in the Imaging Section.  Brief recommendations include the following:  Clinical Impression   Pt presents with improved swallow function compared to most recent MBS on (06/07/17). Pt's swallow was significant for age-related changes to swallow (evidenced by intermittent flash penetration with thin liquid trials). Mild base of tongue weakness persists as evidenced by min vallecular residue across consistencies. Residue was most notable with the pill trial, with pill becoming lodged and then dislodged from vallecula with assistance of puree bolus. Oral phase was functional with timely mastication, transfer, and cohesion of all boluses trialed. Given observed swallow function, recommend diet be advanced up to regular solids and thin liquids per primary SLP with aspiration precuations and medications taken whole in puree.    Swallow Evaluation Recommendations       SLP Diet Recommendations: Regular solids;Thin liquid   Liquid Administration via: Straw;Cup   Medication Administration: Whole meds with puree   Supervision: Patient able to self feed;Intermittent supervision to cue for compensatory strategies   Compensations: Minimize environmental distractions;Slow rate;Small sips/bites   Postural Changes: Seated upright at 90 degrees   Oral Care Recommendations: Oral care BID      [place] Riggin Cuttino SLP Student Clinician   [place] Jyquan Kenley 07/18/2017,1:48 PM

## 2019-11-10 ENCOUNTER — Emergency Department (HOSPITAL_BASED_OUTPATIENT_CLINIC_OR_DEPARTMENT_OTHER): Payer: Medicare Other

## 2019-11-10 ENCOUNTER — Emergency Department (HOSPITAL_BASED_OUTPATIENT_CLINIC_OR_DEPARTMENT_OTHER)
Admission: EM | Admit: 2019-11-10 | Discharge: 2019-11-10 | Disposition: A | Discharge status: Home / Self Care | Payer: Medicare Other | Attending: Emergency Medicine | Admitting: Emergency Medicine

## 2019-11-10 DIAGNOSIS — Z79899 Other long term (current) drug therapy: Secondary | ICD-10-CM | POA: Diagnosis not present

## 2019-11-10 DIAGNOSIS — R519 Headache, unspecified: Secondary | ICD-10-CM | POA: Diagnosis not present

## 2019-11-10 DIAGNOSIS — M25552 Pain in left hip: Secondary | ICD-10-CM | POA: Diagnosis not present

## 2019-11-10 DIAGNOSIS — Z7982 Long term (current) use of aspirin: Secondary | ICD-10-CM | POA: Diagnosis not present

## 2019-11-10 DIAGNOSIS — W19XXXA Unspecified fall, initial encounter: Secondary | ICD-10-CM | POA: Diagnosis not present

## 2019-11-10 DIAGNOSIS — I10 Essential (primary) hypertension: Secondary | ICD-10-CM | POA: Diagnosis not present

## 2019-11-10 DIAGNOSIS — M25512 Pain in left shoulder: Secondary | ICD-10-CM | POA: Insufficient documentation

## 2019-11-10 MED ORDER — CLONIDINE HCL 0.1 MG PO TABS
0.1000 mg | ORAL_TABLET | Freq: Once | ORAL | Status: DC
  Filled 2019-11-10: qty 1

## 2019-11-10 MED ORDER — HYDRALAZINE HCL 25 MG PO TABS
50.0000 mg | ORAL_TABLET | Freq: Once | ORAL | Status: AC
  Administered 2019-11-10: 15:00:00 50 mg via ORAL
  Filled 2019-11-10: qty 2

## 2019-11-10 MED ORDER — CLONIDINE HCL 0.1 MG PO TABS
0.3000 mg | ORAL_TABLET | Freq: Once | ORAL | Status: AC
  Administered 2019-11-10: 15:00:00 0.3 mg via ORAL

## 2019-11-10 MED ORDER — CLONIDINE HCL 0.1 MG PO TABS
ORAL_TABLET | ORAL | Status: AC
  Filled 2019-11-10: qty 2

## 2019-11-10 NOTE — Discharge Instructions (Signed)
As we discussed, the imaging of the head, neck, the shoulder, the hip and the leg did not show any evidence of acute broken bone.  Have patient follow-up with her primary care doctor in the next 2 to 4 days.  Return to emergency department for any worsening pain, vomiting, difficulty breathing or any other worsening or concerning symptoms.

## 2019-11-10 NOTE — ED Notes (Signed)
ED Provider at bedside. 

## 2019-11-10 NOTE — ED Provider Notes (Signed)
[facility] EMERGENCY DEPARTMENT Provider Note   CSN: [id number] Arrival date & time: 11/10/19  1035     History Chief Complaint  Patient presents with  . Fall    Sierra Giles is a 86 y.o. female brought in for evaluation of mechanical fall.  Per EMS and son, patient was getting up from her bed this morning and did not use her walker and fell.  Son did not actually witness the fall but states that he was downstairs and heard it.  He came up immediately.  Patient was slightly confused but did not have any LOC.  Son now reports that patient is at baseline. Patient was able to ambulate a few steps and put weight on the legs when EMS arrived but was still complaining of pian. He states that she was complaining of some pain to the back of her head, left shoulder and left hip.  He states that patient does not have any diagnosis of dementia.  He states that sometimes, she is noncommunicative which is her norm.  He states that she is at her baseline.  She is on aspirin.  No other blood thinners.  EM LEVEL 5 CAVEAT DUE TO HOH. (Paitient did not have her hearing aids.)   The history is provided by the EMS personnel and a relative.       Past Medical History:  Diagnosis Date  . Anxiety   . GERD (gastroesophageal reflux disease)   . Hypercholesterolemia   . Hypertension   . Hyponatremia   . UTI (lower urinary tract infection)     Patient Active Problem List   Diagnosis Date Noted  . Protein-calorie malnutrition, severe 06/03/2017  . Acute respiratory failure with hypoxia (HCC) 05/31/2017  . Normocytic anemia 05/31/2017  . Hypokalemia 05/31/2017  . Pancreatic lesion 05/31/2017  . Anxiety 05/31/2017  . Hypertension 05/31/2017  . CAP (community acquired pneumonia) 05/30/2017    No past surgical history on file.   OB History   No obstetric history on file.     No family history on file.  Social History   Tobacco Use  . Smoking status: Never Smoker  . Smokeless  tobacco: Never Used  Substance Use Topics  . Alcohol use: No  . Drug use: No    Home Medications Prior to Admission medications   Medication Sig Start Date End Date Taking? Authorizing Provider  acetaminophen (TYLENOL) 325 MG tablet Take 2 tablets (650 mg total) by mouth every 6 (six) hours as needed (back pain.). 06/10/17   Arrien, York Ram, MD  aspirin 81 MG tablet Take 81 mg by mouth every other day.    [provider]  bisacodyl (DULCOLAX) 5 MG EC tablet Take 1 tablet (5 mg total) by mouth daily as needed for moderate constipation. 06/10/17   Arrien, York Ram, MD  hydrALAZINE (APRESOLINE) 100 MG tablet Take 100 mg by mouth 3 (three) times daily. 03/31/17   [provider]  latanoprost (XALATAN) 0.005 % ophthalmic solution Place 1 drop into both eyes daily. 05/20/17   [provider]  loratadine (CLARITIN) 10 MG tablet Take 10 mg by mouth daily.    [provider]  LORazepam (ATIVAN) 0.5 MG tablet Take 1 tablet (0.5 mg total) by mouth 2 (two) times daily as needed for anxiety or sleep. 06/10/17   Arrien, York Ram, MD  polyethylene glycol [facility] Ethelene Hal) packet Take 17 g by mouth daily.    [provider]  sodium chloride (OCEAN) 0.65 %  SOLN nasal spray Place 1 spray into both nostrils as needed for congestion. 06/10/17 07/10/17  Arrien, York Ram, MD    Allergies    Sulfamethoxazole-trimethoprim, Amlodipine, Hydrochlorothiazide, Nitrofuran derivatives, and Zolpidem  Review of Systems   Review of Systems  Unable to perform ROS: Age    Physical Exam Updated Vital Signs BP (!) 183/82   Pulse 78   Temp 98.3 F (36.8 C)   Resp 14   SpO2 94%   Physical Exam Vitals and nursing note reviewed.  Constitutional:      Appearance: Normal appearance. She is well-developed.  HENT:     Head: Normocephalic and atraumatic.     Comments: No tenderness to palpation of skull. No deformities or crepitus noted. No open  wounds, abrasions or lacerations.  Eyes:     General: Lids are normal.     Conjunctiva/sclera: Conjunctivae normal.     Pupils: Pupils are equal, round, and reactive to light.     Comments: PERRL. EOMs intact. No nystagmus. No neglect.   Neck:     Comments: Full flexion/extension and lateral movement of neck fully intact. No bony midline tenderness. No deformities or crepitus.  Cardiovascular:     Rate and Rhythm: Normal rate and regular rhythm.     Pulses: Normal pulses.          Radial pulses are 2+ on the right side and 2+ on the left side.       Dorsalis pedis pulses are 2+ on the right side and 2+ on the left side.     Heart sounds: Normal heart sounds. No murmur heard.  No friction rub. No gallop.   Pulmonary:     Effort: Pulmonary effort is normal.     Breath sounds: Normal breath sounds.     Comments: Lungs clear to auscultation bilaterally.  Symmetric chest rise.  No wheezing, rales, rhonchi. Abdominal:     Palpations: Abdomen is soft. Abdomen is not rigid.     Tenderness: There is no abdominal tenderness. There is no guarding.     Comments: Abdomen is soft, non-distended, non-tender. No rigidity, No guarding. No peritoneal signs.  Musculoskeletal:        General: Normal range of motion.     Cervical back: Full passive range of motion without pain.     Comments: No pelvic instability noted.  I am able to passively range of motion her left hip without any signs of deformity or crepitus noted.  She does report some mild pain with range of motion.  No bony deformity or crepitus noted to left femur, left knee, left tib-fib.  No bony tenderness noted to right hip.  No deformity or crepitus.  Full range of motion of hip without any difficulty.  No bony tenderness of the right femur, right tib-fib, right ankle.  Full passive range of motion of left shoulder with any difficulty.  Though she does report some pain with this range of motion.  No bony tenderness noted to left elbow, left  wrist, left forearm.  No deformity or bony crepitus noted of the left upper extremity.  Full range of motion of right upper extremities any difficulty.  No midline T or L-spine tenderness.  Skin:    General: Skin is warm and dry.     Capillary Refill: Capillary refill takes less than 2 seconds.     Comments: Good distal cap refill.  BUE and BLE is not dusky in appearance or cool to touch.  Neurological:  Mental Status: She is alert.     Comments: Alert and oriented x1.  She can tell me her name.  Follows some commands.  Psychiatric:        Speech: Speech normal.     ED Results / Procedures / Treatments   Labs (all labs ordered are listed, but only abnormal results are displayed) Labs Reviewed - No data to display  EKG None  Radiology DG Pelvis 1-2 Views  Result Date: 11/10/2019 CLINICAL DATA:  Left hip pain after fall. EXAM: PELVIS - 1-2 VIEW COMPARISON:  CT abdomen pelvis dated January 01, 2018. FINDINGS: There is no evidence of pelvic fracture or diastasis. No pelvic bone lesions are seen. Osteopenia. Chondrocalcinosis of both hip joints. IMPRESSION: 1. No acute osseous abnormality. Electronically Signed   By: Obie Dredge M.D.   On: 11/10/2019 12:17   CT Head Wo Contrast  Result Date: 11/10/2019 CLINICAL DATA:  Fall. Additional history provided: Left shoulder pain, left hip pain, soreness to back of head. EXAM: CT HEAD WITHOUT CONTRAST CT CERVICAL SPINE WITHOUT CONTRAST TECHNIQUE: Multidetector CT imaging of the head and cervical spine was performed following the standard protocol without intravenous contrast. Multiplanar CT image reconstructions of the cervical spine were also generated. COMPARISON:  Report from head CT 07/19/2014 (images unavailable). FINDINGS: CT HEAD FINDINGS Brain: The examination is motion degraded. Most notably, there is moderate motion degradation at the level of the skull base and posterior fossa, limiting. Mild generalized parenchymal atrophy. No  acute intracranial hemorrhage or demarcated cortical infarction is identified No extra-axial fluid collection is identified. No evidence of intracranial mass. No midline shift. Vascular: No hyperdense vessel.  Atherosclerotic calcifications. Skull: No calvarial fracture is identified. Lucent foci within the occipital calvarium likely reflect prominent arachnoid granulations. Sinuses/Orbits: Visualized orbits show no acute finding. No significant paranasal sinus disease at the imaged levels. Right mastoid effusion CT CERVICAL SPINE FINDINGS Mildly motion degraded examination. Alignment: Exaggerated cervical lordosis. Mild C2-C3 and C3-C4 grade 1 retrolisthesis. Mild C5-C6 and C7-T1 grade 1 anterolisthesis Skull base and vertebrae: The basion-dental and atlanto-dental intervals are maintained.No evidence of acute fracture to the cervical spine. Mild chronic T1 vertebral compression deformity. Redemonstrated mild chronic T2 superior endplate compression deformity. Soft tissues and spinal canal: No prevertebral fluid or swelling. No visible canal hematoma. Disc levels: Cervical spondylosis with multilevel disc space narrowing, uncovertebral and facet hypertrophy. Degenerative changes are also present at the C1-C2 articulation. No high-grade bony spinal canal stenosis. Upper chest: No consolidation within the imaged lung apices. No visible pneumothorax. IMPRESSION: CT head: 1. Significantly motion degraded examination, limiting evaluation. 2. No definite acute intracranial abnormality is identified. 3. Right mastoid effusion. CT cervical spine: 1. Mildly motion degraded examination. 2. No evidence of acute fracture to the cervical spine. 3. Redemonstrated mild chronic T1 compression deformity. 4. Redemonstrated mild chronic T2 superior endplate compression deformity. 5. Mild C2-C3 and C3-C4 grade 1 retrolisthesis. Mild C5-C6 and C7-T1 grade 1 anterolisthesis. 6. Cervical spondylosis as described. Electronically Signed    By: Jackey Loge DO   On: 11/10/2019 12:35   CT Cervical Spine Wo Contrast  Result Date: 11/10/2019 CLINICAL DATA:  Fall. Additional history provided: Left shoulder pain, left hip pain, soreness to back of head. EXAM: CT HEAD WITHOUT CONTRAST CT CERVICAL SPINE WITHOUT CONTRAST TECHNIQUE: Multidetector CT imaging of the head and cervical spine was performed following the standard protocol without intravenous contrast. Multiplanar CT image reconstructions of the cervical spine were also generated. COMPARISON:  Report from head  CT 07/19/2014 (images unavailable). FINDINGS: CT HEAD FINDINGS Brain: The examination is motion degraded. Most notably, there is moderate motion degradation at the level of the skull base and posterior fossa, limiting. Mild generalized parenchymal atrophy. No acute intracranial hemorrhage or demarcated cortical infarction is identified No extra-axial fluid collection is identified. No evidence of intracranial mass. No midline shift. Vascular: No hyperdense vessel.  Atherosclerotic calcifications. Skull: No calvarial fracture is identified. Lucent foci within the occipital calvarium likely reflect prominent arachnoid granulations. Sinuses/Orbits: Visualized orbits show no acute finding. No significant paranasal sinus disease at the imaged levels. Right mastoid effusion CT CERVICAL SPINE FINDINGS Mildly motion degraded examination. Alignment: Exaggerated cervical lordosis. Mild C2-C3 and C3-C4 grade 1 retrolisthesis. Mild C5-C6 and C7-T1 grade 1 anterolisthesis Skull base and vertebrae: The basion-dental and atlanto-dental intervals are maintained.No evidence of acute fracture to the cervical spine. Mild chronic T1 vertebral compression deformity. Redemonstrated mild chronic T2 superior endplate compression deformity. Soft tissues and spinal canal: No prevertebral fluid or swelling. No visible canal hematoma. Disc levels: Cervical spondylosis with multilevel disc space narrowing, uncovertebral  and facet hypertrophy. Degenerative changes are also present at the C1-C2 articulation. No high-grade bony spinal canal stenosis. Upper chest: No consolidation within the imaged lung apices. No visible pneumothorax. IMPRESSION: CT head: 1. Significantly motion degraded examination, limiting evaluation. 2. No definite acute intracranial abnormality is identified. 3. Right mastoid effusion. CT cervical spine: 1. Mildly motion degraded examination. 2. No evidence of acute fracture to the cervical spine. 3. Redemonstrated mild chronic T1 compression deformity. 4. Redemonstrated mild chronic T2 superior endplate compression deformity. 5. Mild C2-C3 and C3-C4 grade 1 retrolisthesis. Mild C5-C6 and C7-T1 grade 1 anterolisthesis. 6. Cervical spondylosis as described. Electronically Signed   By: Jackey Loge DO   On: 11/10/2019 12:35   DG Shoulder Left  Result Date: 11/10/2019 CLINICAL DATA:  Chronic left shoulder pain.  Fell earlier today. EXAM: LEFT SHOULDER - 2+ VIEW COMPARISON:  None. FINDINGS: There is no evidence of acute fracture or dislocation. Old healed displaced fracture of the inferior scapula. There is no evidence of arthropathy or other focal bone abnormality. Osteopenia. Soft tissues are unremarkable. IMPRESSION: 1. No acute osseous abnormality. 2. Old healed displaced fracture of the inferior scapula. Electronically Signed   By: Obie Dredge M.D.   On: 11/10/2019 12:18   DG Femur Min 2 Views Left  Result Date: 11/10/2019 CLINICAL DATA:  Left hip pain after fall. EXAM: LEFT FEMUR 2 VIEWS COMPARISON:  None. FINDINGS: There is no evidence of fracture or other focal bone lesions. Osteopenia. Partially visualized right knee arthroplasty. Soft tissues are unremarkable. IMPRESSION: 1. No acute osseous abnormality. Electronically Signed   By: Obie Dredge M.D.   On: 11/10/2019 12:19    Procedures Procedures (including critical care time)  Medications Ordered in ED Medications  hydrALAZINE  (APRESOLINE) tablet 50 mg (50 mg Oral Given 11/10/19 1435)  cloNIDine (CATAPRES) tablet 0.3 mg (0.3 mg Oral Given 11/10/19 1435)    ED Course  I have reviewed the triage vital signs and the nursing notes.  Pertinent labs & imaging results that were available during my care of the patient were reviewed by me and considered in my medical decision making (see chart for details).    MDM Rules/Calculators/A&P                          87 year old female who presents for evaluation after a mechanical fall that occurred earlier this  morning.  Fall was unwitnessed.  Son saw her shortly after.  Does not think she had any LOC.  She is on aspirin.  No other blood thinners.  Patient had been complaining of pain to her left hip, head.  Also been complaining of some pain to her left shoulder which son says is chronic in nature.  On my evaluation, she is moving all extremities spontaneously.  She does have some difficulty following commands.  This is most likely due to hard of hearing.  No obvious deformity or crepitus noted though she does report some pain when I range of motion her left hip.  We will plan for imaging of head, neck, shoulder, left hip, left femur.  CT head negative for any acute abnormality.  CT cervical spine shows no evidence of acute fracture or dislocation.  There is mention of chronic T1 compression deformity as well as chronic T2 endplate compression deformity.  Pelvic x-ray negative for any acute bony abnormality.  Femur x-ray negative for any acute bony abnormality.  Shoulder x-ray negative for any acute bony abnormality.  Reevaluation.  Patient is resting comfortably.  Son states that patient is at mental baseline.  Patient still has some slight pain with range of motion of her left hip but is able to lift it up off the bed.  She has been able to take a few steps with assistance when EMS got there. Discussed patient with Dr. Pilar PlateBero who evaluated the patient. At this time, patient exhibits no  emergent life-threatening condition that require further evaluation in ED or admission. Patient had ample opportunity for questions and discussion. All patient's questions were answered with full understanding. Strict return precautions discussed. Patient expresses understanding and agreement to plan.   Portions of this note were generated with Scientist, clinical (histocompatibility and immunogenetics)Dragon dictation software. Dictation errors may occur despite best attempts at proofreading.  Final Clinical Impression(s) / ED Diagnoses Final diagnoses:  Fall, initial encounter    Rx / DC Orders ED Discharge Orders    None       Rosana HoesLayden, Rosalind Guido A, PA-C 11/10/19 1820    Sabas SousBero, Michael M, MD 11/11/19 [phone]

## 2019-11-10 NOTE — ED Triage Notes (Signed)
Per EMS:  Pt got up this am, didn't use her walker and fell.  Pt slightly confused but denies loc.  Pt c/o left shoulder pain (which is chronic per son), left hip pain (new), and soreness to back of head.  No obvious trauma, no deformities.

## 2021-03-20 ENCOUNTER — Emergency Department (HOSPITAL_BASED_OUTPATIENT_CLINIC_OR_DEPARTMENT_OTHER): Payer: Medicare Other

## 2021-03-20 ENCOUNTER — Encounter (HOSPITAL_BASED_OUTPATIENT_CLINIC_OR_DEPARTMENT_OTHER): Payer: Self-pay | Admitting: Emergency Medicine

## 2021-03-20 ENCOUNTER — Emergency Department (HOSPITAL_BASED_OUTPATIENT_CLINIC_OR_DEPARTMENT_OTHER)
Admission: EM | Admit: 2021-03-20 | Discharge: 2021-03-20 | Disposition: A | Discharge status: Home / Self Care | Payer: Medicare Other | Attending: Emergency Medicine | Admitting: Emergency Medicine

## 2021-03-20 ENCOUNTER — Other Ambulatory Visit: Payer: Self-pay

## 2021-03-20 DIAGNOSIS — Z20822 Contact with and (suspected) exposure to covid-19: Secondary | ICD-10-CM | POA: Diagnosis not present

## 2021-03-20 DIAGNOSIS — S0990XA Unspecified injury of head, initial encounter: Secondary | ICD-10-CM | POA: Diagnosis not present

## 2021-03-20 DIAGNOSIS — M25552 Pain in left hip: Secondary | ICD-10-CM | POA: Diagnosis not present

## 2021-03-20 DIAGNOSIS — Y92009 Unspecified place in unspecified non-institutional (private) residence as the place of occurrence of the external cause: Secondary | ICD-10-CM | POA: Diagnosis not present

## 2021-03-20 DIAGNOSIS — W01198A Fall on same level from slipping, tripping and stumbling with subsequent striking against other object, initial encounter: Secondary | ICD-10-CM | POA: Diagnosis not present

## 2021-03-20 DIAGNOSIS — I10 Essential (primary) hypertension: Secondary | ICD-10-CM | POA: Diagnosis not present

## 2021-03-20 DIAGNOSIS — W19XXXA Unspecified fall, initial encounter: Secondary | ICD-10-CM

## 2021-03-20 LAB — COMPREHENSIVE METABOLIC PANEL
ALT: 14 U/L (ref 0–44)
AST: 19 U/L (ref 15–41)
Albumin: 3.6 g/dL (ref 3.5–5.0)
Alkaline Phosphatase: 98 U/L (ref 38–126)
Anion gap: 8 (ref 5–15)
BUN: 17 mg/dL (ref 8–23)
CO2: 28 mmol/L (ref 22–32)
Calcium: 8.7 mg/dL — ABNORMAL LOW (ref 8.9–10.3)
Chloride: 96 mmol/L — ABNORMAL LOW (ref 98–111)
Creatinine, Ser: 0.74 mg/dL (ref 0.44–1.00)
GFR, Estimated: >60 mL/min (ref >60)
Glucose, Bld: 148 mg/dL — ABNORMAL HIGH (ref 70–99)
Potassium: 3.9 mmol/L (ref 3.5–5.1)
Sodium: 132 mmol/L — ABNORMAL LOW (ref 135–145)
Total Bilirubin: 0.5 mg/dL (ref 0.3–1.2)
Total Protein: 7.5 g/dL (ref 6.5–8.1)

## 2021-03-20 LAB — URINALYSIS, ROUTINE W REFLEX MICROSCOPIC
Bilirubin Urine: NEGATIVE
Glucose, UA: NEGATIVE mg/dL
Hgb urine dipstick: NEGATIVE
Ketones, ur: NEGATIVE mg/dL
Leukocytes,Ua: NEGATIVE
Nitrite: POSITIVE — AB
Protein, ur: 30 mg/dL — AB
Specific Gravity, Urine: 1.025 (ref 1.005–1.030)
pH: 6 (ref 5.0–8.0)

## 2021-03-20 LAB — CBC WITH DIFFERENTIAL/PLATELET
Abs Immature Granulocytes: 0.06 10*3/uL (ref 0.00–0.07)
Basophils Absolute: 0 10*3/uL (ref 0.0–0.1)
Basophils Relative: 0 %
Eosinophils Absolute: 0 10*3/uL (ref 0.0–0.5)
Eosinophils Relative: 0 %
HCT: 40.4 % (ref 36.0–46.0)
Hemoglobin: 13.6 g/dL (ref 12.0–15.0)
Immature Granulocytes: 1 %
Lymphocytes Relative: 8 %
Lymphs Abs: 0.8 10*3/uL (ref 0.7–4.0)
MCH: 30.3 pg (ref 26.0–34.0)
MCHC: 33.7 g/dL (ref 30.0–36.0)
MCV: 90 fL (ref 80.0–100.0)
Monocytes Absolute: 0.4 10*3/uL (ref 0.1–1.0)
Monocytes Relative: 4 %
Neutro Abs: 8.2 10*3/uL — ABNORMAL HIGH (ref 1.7–7.7)
Neutrophils Relative %: 87 %
Platelets: 229 10*3/uL (ref 150–400)
RBC: 4.49 MIL/uL (ref 3.87–5.11)
RDW: 12.6 % (ref 11.5–15.5)
WBC: 9.5 10*3/uL (ref 4.0–10.5)
nRBC: 0 % (ref 0.0–0.2)

## 2021-03-20 LAB — TROPONIN I (HIGH SENSITIVITY): Troponin I (High Sensitivity): 7 ng/L (ref <18)

## 2021-03-20 LAB — RESP PANEL BY RT-PCR (FLU A&B, COVID) ARPGX2
Influenza A by PCR: NEGATIVE
Influenza B by PCR: NEGATIVE
SARS Coronavirus 2 by RT PCR: NEGATIVE

## 2021-03-20 LAB — URINALYSIS, MICROSCOPIC (REFLEX): RBC / HPF: NONE SEEN RBC/hpf (ref 0–5)

## 2021-03-20 MED ORDER — DICLOFENAC SODIUM 1 % EX GEL: 2.0000 g | Freq: Four times a day (QID) | CUTANEOUS | 0 refills | Status: AC | PRN

## 2021-03-20 MED ORDER — HYDRALAZINE HCL 25 MG PO TABS
100.0000 mg | ORAL_TABLET | Freq: Once | ORAL | Status: AC
  Administered 2021-03-20: 21:00:00 100 mg via ORAL
  Filled 2021-03-20: qty 4

## 2021-03-20 MED ORDER — ACETAMINOPHEN 325 MG PO TABS
650.0000 mg | ORAL_TABLET | Freq: Once | ORAL | Status: AC
  Administered 2021-03-20: 19:00:00 650 mg via ORAL
  Filled 2021-03-20: qty 2

## 2021-03-20 NOTE — ED Notes (Signed)
Purwick placed

## 2021-03-20 NOTE — ED Provider Notes (Signed)
Emergency Department Provider Note   I have reviewed the triage vital signs and the nursing notes.   HISTORY  Chief Complaint Fall   HPI Sierra Giles is a 87 y.o. female with PMH reviewed below presents to the emergency department for evaluation after mechanical fall at home.  Patient lives at home with her son and husband.  She fell backwards crashing into the top of the glass table which did not break.  There was no loss of consciousness.  Patient is not on blood thinners.  She is complaining of pain to the left hip and may have struck the back of her head.  No chest pain or shortness of breath.  Son, at bedside, states that she is currently at her mental status baseline.   Past Medical History:  Diagnosis Date   Anxiety    GERD (gastroesophageal reflux disease)    Hypercholesterolemia    Hypertension    Hyponatremia    UTI (lower urinary tract infection)     Patient Active Problem List   Diagnosis Date Noted   Protein-calorie malnutrition, severe 06/03/2017   Acute respiratory failure with hypoxia (HCC) 05/31/2017   Normocytic anemia 05/31/2017   Hypokalemia 05/31/2017   Pancreatic lesion 05/31/2017   Anxiety 05/31/2017   Hypertension 05/31/2017   CAP (community acquired pneumonia) 05/30/2017    History reviewed. No pertinent surgical history.  Allergies Sulfamethoxazole-trimethoprim, Amlodipine, Hydrochlorothiazide, Nitrofuran derivatives, and Zolpidem  No family history on file.  Social History Social History   Tobacco Use   Smoking status: Never   Smokeless tobacco: Never  Substance Use Topics   Alcohol use: No   Drug use: No    Review of Systems  Constitutional: No fever/chills Eyes: No visual changes. ENT: No sore throat. Cardiovascular: Denies chest pain. Respiratory: Denies shortness of breath. Gastrointestinal: No abdominal pain.  No nausea, no vomiting.  No diarrhea.  No constipation. Genitourinary: Negative for  dysuria. Musculoskeletal: Negative for back pain. Positive left leg pain.  Skin: Negative for rash. Neurological: Negative for focal weakness or numbness. Positive HA.   10-point ROS otherwise negative.  ____________________________________________   PHYSICAL EXAM:  VITAL SIGNS: Vitals:   03/20/21 2000 03/20/21 2030  BP: (!) 199/85 (!) 192/77  Pulse: 82 76  Resp: 18 19  Temp:    SpO2: 92% 91%    Constitutional: Alert. Well appearing and in no acute distress. Eyes: Conjunctivae are normal. PERRL.  Head: Atraumatic. Nose: No congestion/rhinnorhea. Mouth/Throat: Mucous membranes are moist.  Oropharynx non-erythematous. Neck: No stridor. C collar in place. No midline tenderness.  Cardiovascular: Normal rate, regular rhythm. Good peripheral circulation. Grossly normal heart sounds.   Respiratory: Normal respiratory effort.  No retractions. Lungs CTAB. Gastrointestinal: Soft and nontender. No distention.  Musculoskeletal: No lower extremity tenderness nor edema. No gross deformities of extremities. Normal ROM of the bilateral upper and lower extremities.  Neurologic:  Normal speech and language. No gross focal neurologic deficits are appreciated.  Skin:  Skin is warm, dry and intact. No rash noted. No bruising or lacerations.    ____________________________________________   LABS (all labs ordered are listed, but only abnormal results are displayed)  Labs Reviewed  COMPREHENSIVE METABOLIC PANEL - Abnormal; Notable for the following components:      Result Value   Sodium 132 (*)    Chloride 96 (*)    Glucose, Bld 148 (*)    Calcium 8.7 (*)    All other components within normal limits  CBC WITH DIFFERENTIAL/PLATELET - Abnormal; Notable  for the following components:   Neutro Abs 8.2 (*)    All other components within normal limits  URINALYSIS, ROUTINE W REFLEX MICROSCOPIC - Abnormal; Notable for the following components:   APPearance CLOUDY (*)    Protein, ur 30 (*)     Nitrite POSITIVE (*)    All other components within normal limits  URINALYSIS, MICROSCOPIC (REFLEX) - Abnormal; Notable for the following components:   Bacteria, UA FEW (*)    All other components within normal limits  RESP PANEL BY RT-PCR (FLU A&B, COVID) ARPGX2  TROPONIN I (HIGH SENSITIVITY)  TROPONIN I (HIGH SENSITIVITY)   ____________________________________________  EKG   EKG Interpretation  Date/Time:  Monday March 20 2021 18:11:28 EST Ventricular Rate:  89 PR Interval:    QRS Duration: 84 QT Interval:  362 QTC Calculation: 441 R Axis:   91 Text Interpretation: Sinus rhythm Right axis deviation Anteroseptal infarct, old Similar to 2017 tracing Confirmed by Alona Bene [phone]) on 03/20/2021 7:56:34 PM        ____________________________________________  RADIOLOGY   Plain films of the chest and pelvis reviewed.  CT imaging of the head and cervical spine without acute findings. ____________________________________________   PROCEDURES  Procedure(s) performed:   Procedures  None  ____________________________________________   INITIAL IMPRESSION / ASSESSMENT AND PLAN / ED COURSE  Pertinent labs & imaging results that were available during my care of the patient were reviewed by me and considered in my medical decision making (see chart for details).   Patient presents emergency department by EMS after fall at home.  Fall appears to been mechanical while tripping over a coffee table which did not break.  No bony tenderness to palpation of the bilateral upper or lower extremities with normal range of motion.  Plain films of the chest and pelvis without acute findings.  CT imaging of the head and neck are similarly unremarkable.  Patient with no leukocytosis or obvious UTI symptoms.  The son notes that she is on suppressive antibiotic therapy for urinary tract infection.  Her mental status is normal.  She is not febrile.  After discussion with son, opted to send  urine for culture and we will hold on adding antibiotics at this time.  She is up and ambulatory here in the emergency department stable for discharge.   ____________________________________________  FINAL CLINICAL IMPRESSION(S) / ED DIAGNOSES  Final diagnoses:  Fall, initial encounter     MEDICATIONS GIVEN DURING THIS VISIT:  Medications  acetaminophen (TYLENOL) tablet 650 mg (650 mg Oral Given 03/20/21 1901)  hydrALAZINE (APRESOLINE) tablet 100 mg (100 mg Oral Given 03/20/21 2059)     NEW OUTPATIENT MEDICATIONS STARTED DURING THIS VISIT:  Discharge Medication List as of 03/20/2021  8:31 PM     START taking these medications   Details  diclofenac Sodium (VOLTAREN) 1 % GEL Apply 2 g topically 4 (four) times daily as needed., Starting Mon 03/20/2021, Print        Note:  This document was prepared using Dragon voice recognition software and may include unintentional dictation errors.  Alona Bene, MD, [facility] Emergency Medicine    Jahmez Bily, Arlyss Repress, MD 03/21/21 2125

## 2021-03-20 NOTE — ED Notes (Signed)
Patient transported to CT 

## 2021-03-20 NOTE — ED Notes (Signed)
Per son pt was walking with her walker and hit the table with the walker and fell  aagin no LOC and no bloodthinners

## 2021-03-20 NOTE — Discharge Instructions (Addendum)
You were seen in the emerge department today after a fall.  Your lab work here is reassuring.  I will send her urine for culture and we will call to start antibiotics if needed.  You may take Tylenol as needed for pain.  I am calling in some topical pain medications as well.  Please follow closely with your primary care doctor.

## 2021-03-20 NOTE — ED Triage Notes (Addendum)
Pt  tripped and fell into glass top coffee table hitting the back of both legs  no LOC or blood thinners per ems , pt from home , when pt moves she grips her left hip and leg , per ems pt has bruise to  rt lower leg

## 2021-08-09 ENCOUNTER — Emergency Department (HOSPITAL_BASED_OUTPATIENT_CLINIC_OR_DEPARTMENT_OTHER): Payer: Medicare Other

## 2021-08-09 ENCOUNTER — Other Ambulatory Visit: Payer: Self-pay

## 2021-08-09 ENCOUNTER — Emergency Department (HOSPITAL_BASED_OUTPATIENT_CLINIC_OR_DEPARTMENT_OTHER)
Admission: EM | Admit: 2021-08-09 | Discharge: 2021-08-09 | Disposition: A | Discharge status: Home / Self Care | Payer: Medicare Other | Attending: Emergency Medicine | Admitting: Emergency Medicine

## 2021-08-09 ENCOUNTER — Encounter (HOSPITAL_BASED_OUTPATIENT_CLINIC_OR_DEPARTMENT_OTHER): Payer: Self-pay | Admitting: Emergency Medicine

## 2021-08-09 DIAGNOSIS — S0990XA Unspecified injury of head, initial encounter: Secondary | ICD-10-CM | POA: Diagnosis not present

## 2021-08-09 DIAGNOSIS — S61411A Laceration without foreign body of right hand, initial encounter: Secondary | ICD-10-CM | POA: Insufficient documentation

## 2021-08-09 DIAGNOSIS — W19XXXA Unspecified fall, initial encounter: Secondary | ICD-10-CM | POA: Insufficient documentation

## 2021-08-09 DIAGNOSIS — Y92009 Unspecified place in unspecified non-institutional (private) residence as the place of occurrence of the external cause: Secondary | ICD-10-CM | POA: Insufficient documentation

## 2021-08-09 DIAGNOSIS — I10 Essential (primary) hypertension: Secondary | ICD-10-CM | POA: Insufficient documentation

## 2021-08-09 DIAGNOSIS — M25551 Pain in right hip: Secondary | ICD-10-CM | POA: Diagnosis not present

## 2021-08-09 MED ORDER — CEPHALEXIN 500 MG PO CAPS: 500.0000 mg | ORAL_CAPSULE | Freq: Two times a day (BID) | ORAL | 0 refills | Status: AC

## 2021-08-09 MED ORDER — ACETAMINOPHEN 500 MG PO TABS
1000.0000 mg | ORAL_TABLET | Freq: Once | ORAL | Status: AC
  Administered 2021-08-09: 500 mg via ORAL
  Filled 2021-08-09: qty 2

## 2021-08-09 NOTE — ED Provider Notes (Signed)
? ?Emergency Department Provider Note ? ? ?I have reviewed the triage vital signs and the nursing notes. ? ? ?HISTORY ? ?Chief Complaint ?Fall and Hip Pain ? ? ?HPI ?Sierra Giles is a 88 y.o. female with PMH of GERD, HLD, and HTN presents to the emergency department after fall early this morning.  Patient lives at home with her son.  He provides most of the history and states that he heard his mother fall at around 2 AM.  He went to check on her and she was complaining of right hip pain.  She had some bleeding from a skin tear to the right forearm/hand.  EMS was called out who attended to the wound but EMS and family ultimately decided to hold on transport.  The patient has remained awake and alert.  This morning, she was having a lot of trouble putting weight on the right hip due to pain and family arrive here for evaluation. No additional falls. No AMS per son. Patient typical ambulates with a walker although he notes that she cannot walk very far.  ? ? ?Past Medical History:  ?Diagnosis Date  ? Anxiety   ? GERD (gastroesophageal reflux disease)   ? Hypercholesterolemia   ? Hypertension   ? Hyponatremia   ? UTI (lower urinary tract infection)   ? ? ?Review of Systems ? ?Constitutional: No fever/chills ?Cardiovascular: Denies chest pain. ?Respiratory: Denies shortness of breath. ?Gastrointestinal: No abdominal pain.  No nausea, no vomiting.  No diarrhea.  No constipation. ?Genitourinary: Negative for dysuria. ?Musculoskeletal: Positive right hip pain.  ?Skin: Skin tear to the wright forearm/wrist. ?Neurological: Negative for headaches. ? ? ?____________________________________________ ? ? ?PHYSICAL EXAM: ? ?VITAL SIGNS: ?ED Triage Vitals [08/09/21 1122]  ?Enc Vitals Group  ?   BP (!) 175/82  ?   Pulse Rate 69  ?   Resp 16  ?   Temp 97.9 ?F (36.6 ?C)  ?   Temp Source Oral  ?   SpO2 95 %  ? ?Constitutional: Alert and oriented. Well appearing and in no acute distress. ?Eyes: Conjunctivae are normal. PERRL.  EOMI. ?Head: Atraumatic. Slight bruising to the right lateral face adjacent to the right eye.  ?Nose: No congestion/rhinnorhea. ?Mouth/Throat: Mucous membranes are moist.  ?Neck: No stridor.  No cervical spine tenderness to palpation. ?Cardiovascular: Normal rate, regular rhythm. Good peripheral circulation. Grossly normal heart sounds.   ?Respiratory: Normal respiratory effort.  No retractions. Lungs CTAB. ?Gastrointestinal: Soft and nontender. No distention.  ?Musculoskeletal: Wincing and groaning with any attempted ROM of the right hip. No apparent knee or ankle tenderness.  ?Neurologic:  Normal speech and language. No gross focal neurologic deficits are appreciated.  ?Skin:  Skin is warm and dry. Bruising with hematoma and 15 cm skin tear to the right dorsal forearm and hand.  ? ?____________________________________________ ? ?RADIOLOGY ? ?DG Wrist Complete Right ? ?Result Date: 08/09/2021 ?CLINICAL DATA:  Larey SeatFell last evening and injured right wrist. EXAM: RIGHT WRIST - COMPLETE 3+ VIEW COMPARISON:  None. FINDINGS: Age related degenerative changes involving the wrist and CMC joint of the thumb. Chondrocalcinosis is noted. Findings suspicious for a fracture at the base of the fifth metacarpal. Recommend correlation with pain and tenderness over this area. If clinical findings are equivocal a CT may be helpful for further evaluation. IMPRESSION: No acute wrist fracture. Suspect fracture of the base of the fifth metacarpal. Recommend correlation with clinical findings and CT if necessary. Electronically Signed   By: Orlene PlumP.  Gallerani M.D.  On: 08/09/2021 12:37  ? ?CT HEAD WO CONTRAST ( ) ? ?Result Date: 08/09/2021 ?CLINICAL DATA:  Status post fall last night. EXAM: CT HEAD WITHOUT CONTRAST CT CERVICAL SPINE WITHOUT CONTRAST TECHNIQUE: Multidetector CT imaging of the head and cervical spine was performed following the standard protocol without intravenous contrast. Multiplanar CT image reconstructions of the cervical  spine were also generated. RADIATION DOSE REDUCTION: This exam was performed according to the departmental dose-optimization program which includes automated exposure control, adjustment of the mA and/or kV according to patient size and/or use of iterative reconstruction technique. COMPARISON:  03/20/2021 FINDINGS: CT HEAD FINDINGS Brain: No evidence of acute infarction, hemorrhage, extra-axial collection, ventriculomegaly, or mass effect. Generalized cerebral atrophy. Periventricular white matter low attenuation likely secondary to microangiopathy. Vascular: Cerebrovascular atherosclerotic calcifications are noted. No hyperdense vessels. Skull: Negative for fracture or focal lesion. Sinuses/Orbits: Visualized portions of the orbits are unremarkable. Visualized portions of the paranasal sinuses are unremarkable. Visualized portions of the mastoid air cells are unremarkable. Other: None. CT CERVICAL SPINE FINDINGS Alignment: 2 mm retrolisthesis of C2 on C3 and C3-C4. 4 mm anterolisthesis of C5 on C6 secondary to facet disease. Skull base and vertebrae: No acute fracture. No primary bone lesion or focal pathologic process. Generalized osteopenia. Soft tissues and spinal canal: No prevertebral fluid or swelling. No visible canal hematoma. Disc levels: Degenerative disease with disc height loss at C3-4, C4-5, C5-6, C6-7, C7-T1, and T2-3. Mild bilateral facet arthropathy at C2-3. Mild broad-based disc osteophyte complex at C3-4 with moderate bilateral facet arthropathy. At C4-5 there is moderate bilateral facet arthropathy. At C5-6 there is severe right facet arthropathy, moderate left facet arthropathy and bilateral foraminal narrowing. Upper chest: Lung apices are clear. Other: Thoracic aortic atherosclerosis. Carotid artery atherosclerosis. IMPRESSION: 1. No acute intracranial pathology. 2. No acute fracture or subluxation of the cervical spine. 3. Multilevel degenerative changes of the cervical spine as above. 4.  Aortic Atherosclerosis (ICD10-I70.0). Electronically Signed   By: Elige Ko M.D.   On: 08/09/2021 13:04  ? ?CT Cervical Spine Wo Contrast ? ?Result Date: 08/09/2021 ?CLINICAL DATA:  Status post fall last night. EXAM: CT HEAD WITHOUT CONTRAST CT CERVICAL SPINE WITHOUT CONTRAST TECHNIQUE: Multidetector CT imaging of the head and cervical spine was performed following the standard protocol without intravenous contrast. Multiplanar CT image reconstructions of the cervical spine were also generated. RADIATION DOSE REDUCTION: This exam was performed according to the departmental dose-optimization program which includes automated exposure control, adjustment of the mA and/or kV according to patient size and/or use of iterative reconstruction technique. COMPARISON:  03/20/2021 FINDINGS: CT HEAD FINDINGS Brain: No evidence of acute infarction, hemorrhage, extra-axial collection, ventriculomegaly, or mass effect. Generalized cerebral atrophy. Periventricular white matter low attenuation likely secondary to microangiopathy. Vascular: Cerebrovascular atherosclerotic calcifications are noted. No hyperdense vessels. Skull: Negative for fracture or focal lesion. Sinuses/Orbits: Visualized portions of the orbits are unremarkable. Visualized portions of the paranasal sinuses are unremarkable. Visualized portions of the mastoid air cells are unremarkable. Other: None. CT CERVICAL SPINE FINDINGS Alignment: 2 mm retrolisthesis of C2 on C3 and C3-C4. 4 mm anterolisthesis of C5 on C6 secondary to facet disease. Skull base and vertebrae: No acute fracture. No primary bone lesion or focal pathologic process. Generalized osteopenia. Soft tissues and spinal canal: No prevertebral fluid or swelling. No visible canal hematoma. Disc levels: Degenerative disease with disc height loss at C3-4, C4-5, C5-6, C6-7, C7-T1, and T2-3. Mild bilateral facet arthropathy at C2-3. Mild broad-based disc osteophyte complex at C3-4 with moderate bilateral  facet arthropathy. At C4-5 there is moderate bilateral facet arthropathy. At C5-6 there is severe right facet arthropathy, moderate left facet arthropathy and bilateral foraminal narrowing. Upper chest: Lung apices a

## 2021-08-09 NOTE — ED Notes (Signed)
Patient transported to CT 

## 2021-08-09 NOTE — Discharge Instructions (Signed)
You were seen in the emergency department today after fall.  Your CT scan of the head and hip did not show any broken bones.  There is a question of a fracture on the right hand but no appreciable tenderness in this area.  I would like to repeat an x-ray and the next 1 to 2 weeks.  I will list the name of an orthopedist office to help with follow-up.  ? ?I have placed a dressing on the right wrist.  Please change the dressing at least once every 24 hours.  I also arranged with case management to call you regarding setting up home health and delivering a wheelchair.  ?

## 2021-08-09 NOTE — ED Notes (Signed)
ED Provider at bedside. 

## 2021-08-09 NOTE — Progress Notes (Addendum)
RNCM received TOC consult for [facility] services.  ?RNCM left voicemail message for patient's son Sierra Giles [phone].  ?TOC will continue to follow ? ?Addendum: ?RNCM called [facility] nursing station who transferred call to patient's room. RNCM spoke with son Sierra Giles who will call this RNCM back to discuss HHC services.  ?

## 2021-08-09 NOTE — ED Triage Notes (Signed)
Per EMS. Pt from home. Fell last night at 0200. EMS was called at that time for a skin tear on back of R hand, but did not transport at that time. This morning her son noticed she had some difficulty walking and complained of R hip pain at the time. Pt has some confusion at baseline per son. Denies any pain at this time. Hypertensive with EMS, son gave pt's home BP med just before transport.  ?

## 2021-08-09 NOTE — ED Notes (Signed)
Pt also has bruise next to R eye. ?

## 2021-08-09 NOTE — ED Notes (Signed)
Patient transported to CT, unable to obtain vitals at this time. 

## 2021-08-21 ENCOUNTER — Telehealth: Payer: Self-pay

## 2021-08-21 NOTE — Telephone Encounter (Signed)
RNCM received voicemail from patient's son Mellody Dance regarding her ED visit on 08/09/21 for [facility] services. Patient will need to see PCP to get assistance with [facility] since it's been awhile since her ED visit. RNCM unable to leave a message. ?

## 2022-04-13 IMAGING — CR DG FEMUR 2+V*L*
4 series · 4 of 4 positions shown · non-contrast
Comparison: None.

CLINICAL DATA: Left hip pain after fall.

EXAM:
LEFT FEMUR 2 VIEWS

[t femur with hip  ap left]
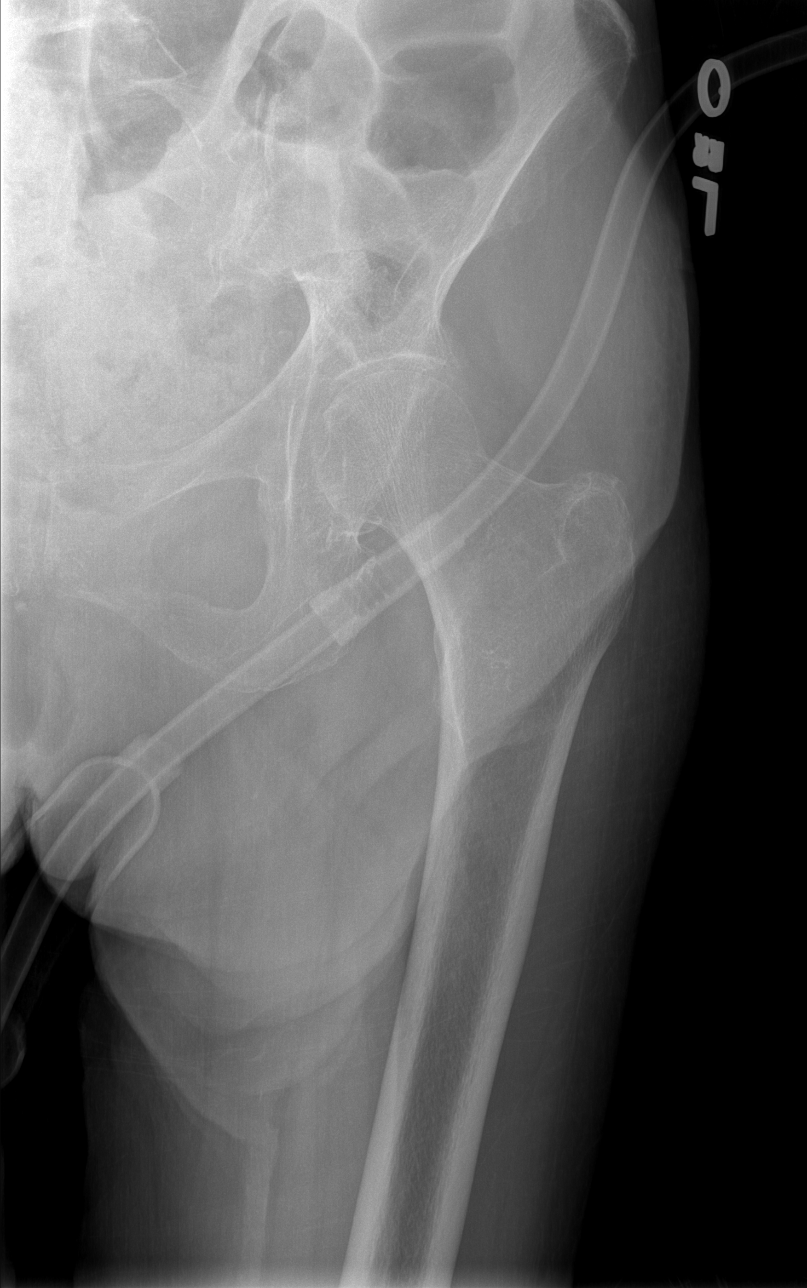

[t femur with knee ap left]
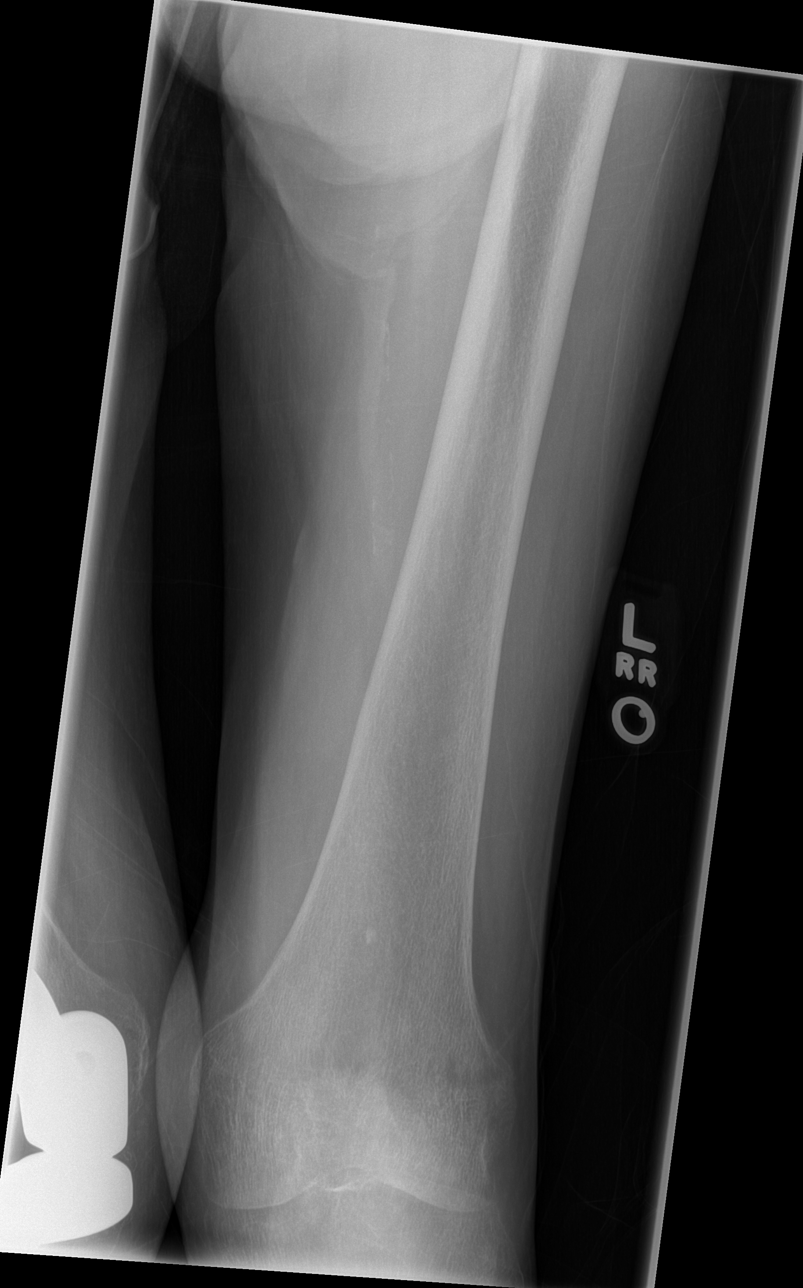

[t femur with hip lat left]
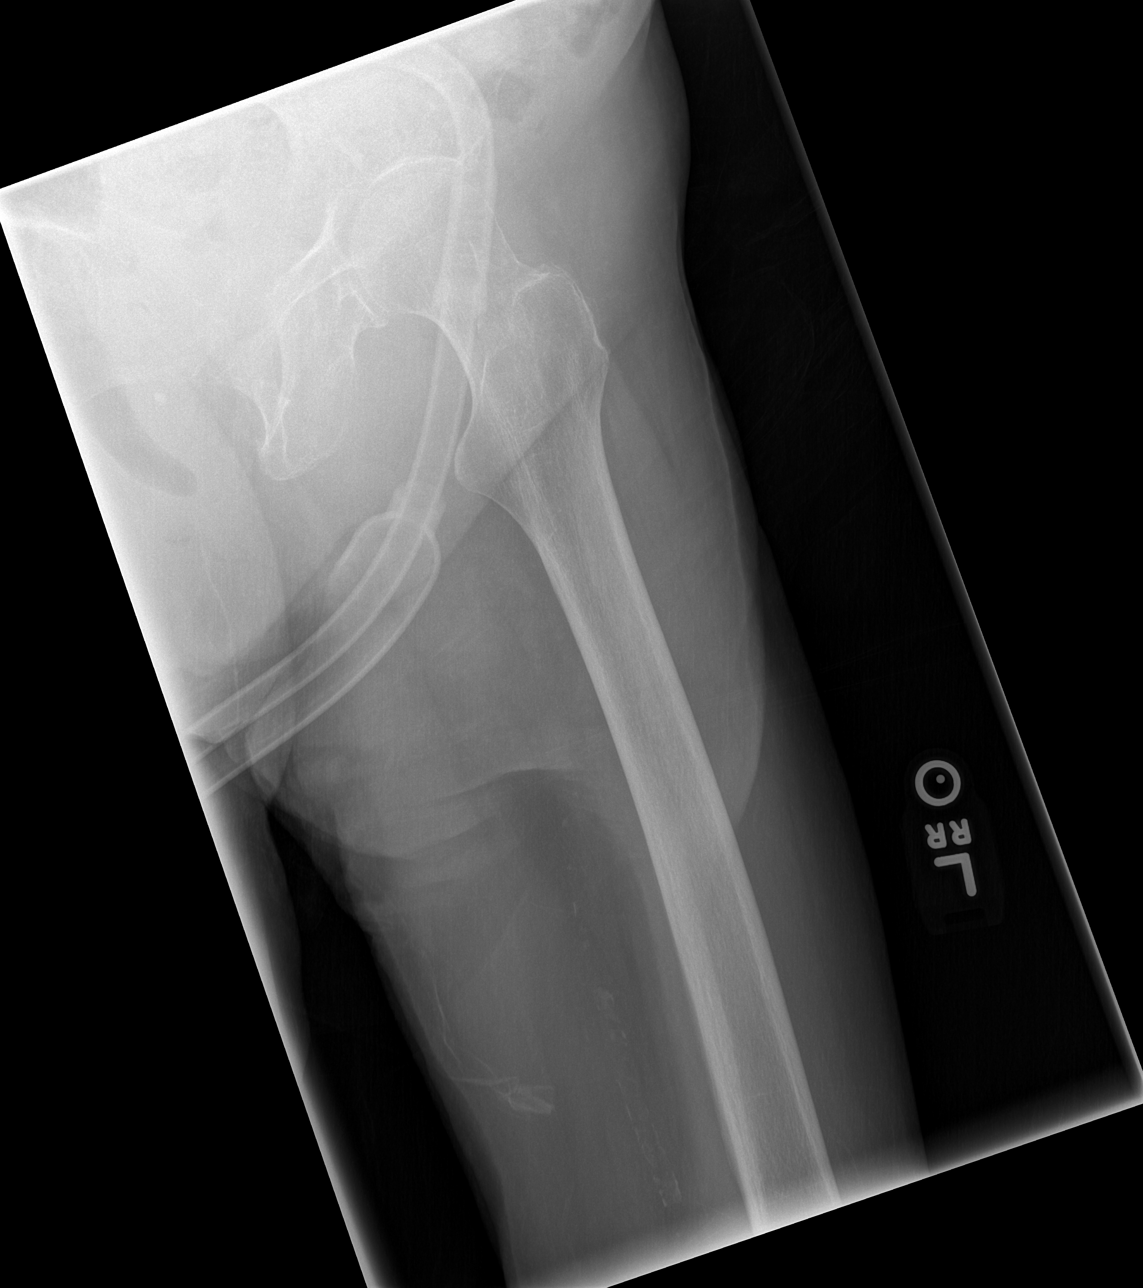

[t femur with knee lat left]
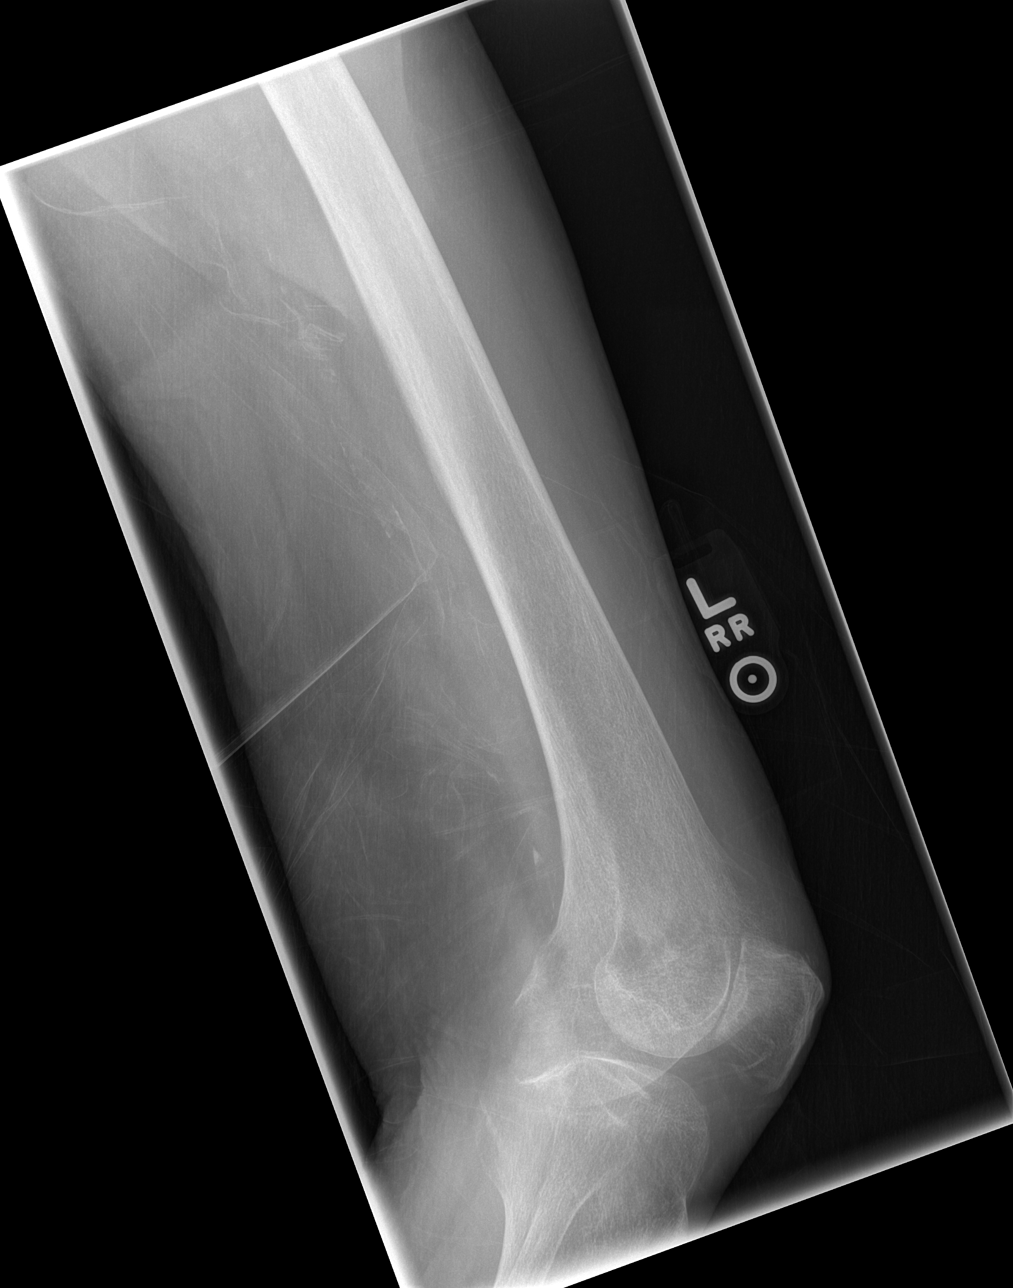

[4 of 4 positions shown; findings below may reference images not displayed]

FINDINGS: There is no evidence of fracture or other focal bone lesions.
Osteopenia. Partially visualized right knee arthroplasty. Soft
tissues are unremarkable.
IMPRESSION: 1. No acute osseous abnormality.

## 2022-04-13 IMAGING — CR DG SHOULDER 2+V*L*
2 series · 2 of 2 positions shown · non-contrast
Comparison: None.

CLINICAL DATA: Chronic left shoulder pain.  Fell earlier today.

EXAM:
LEFT SHOULDER - 2+ VIEW

[t shoulder ap external left]
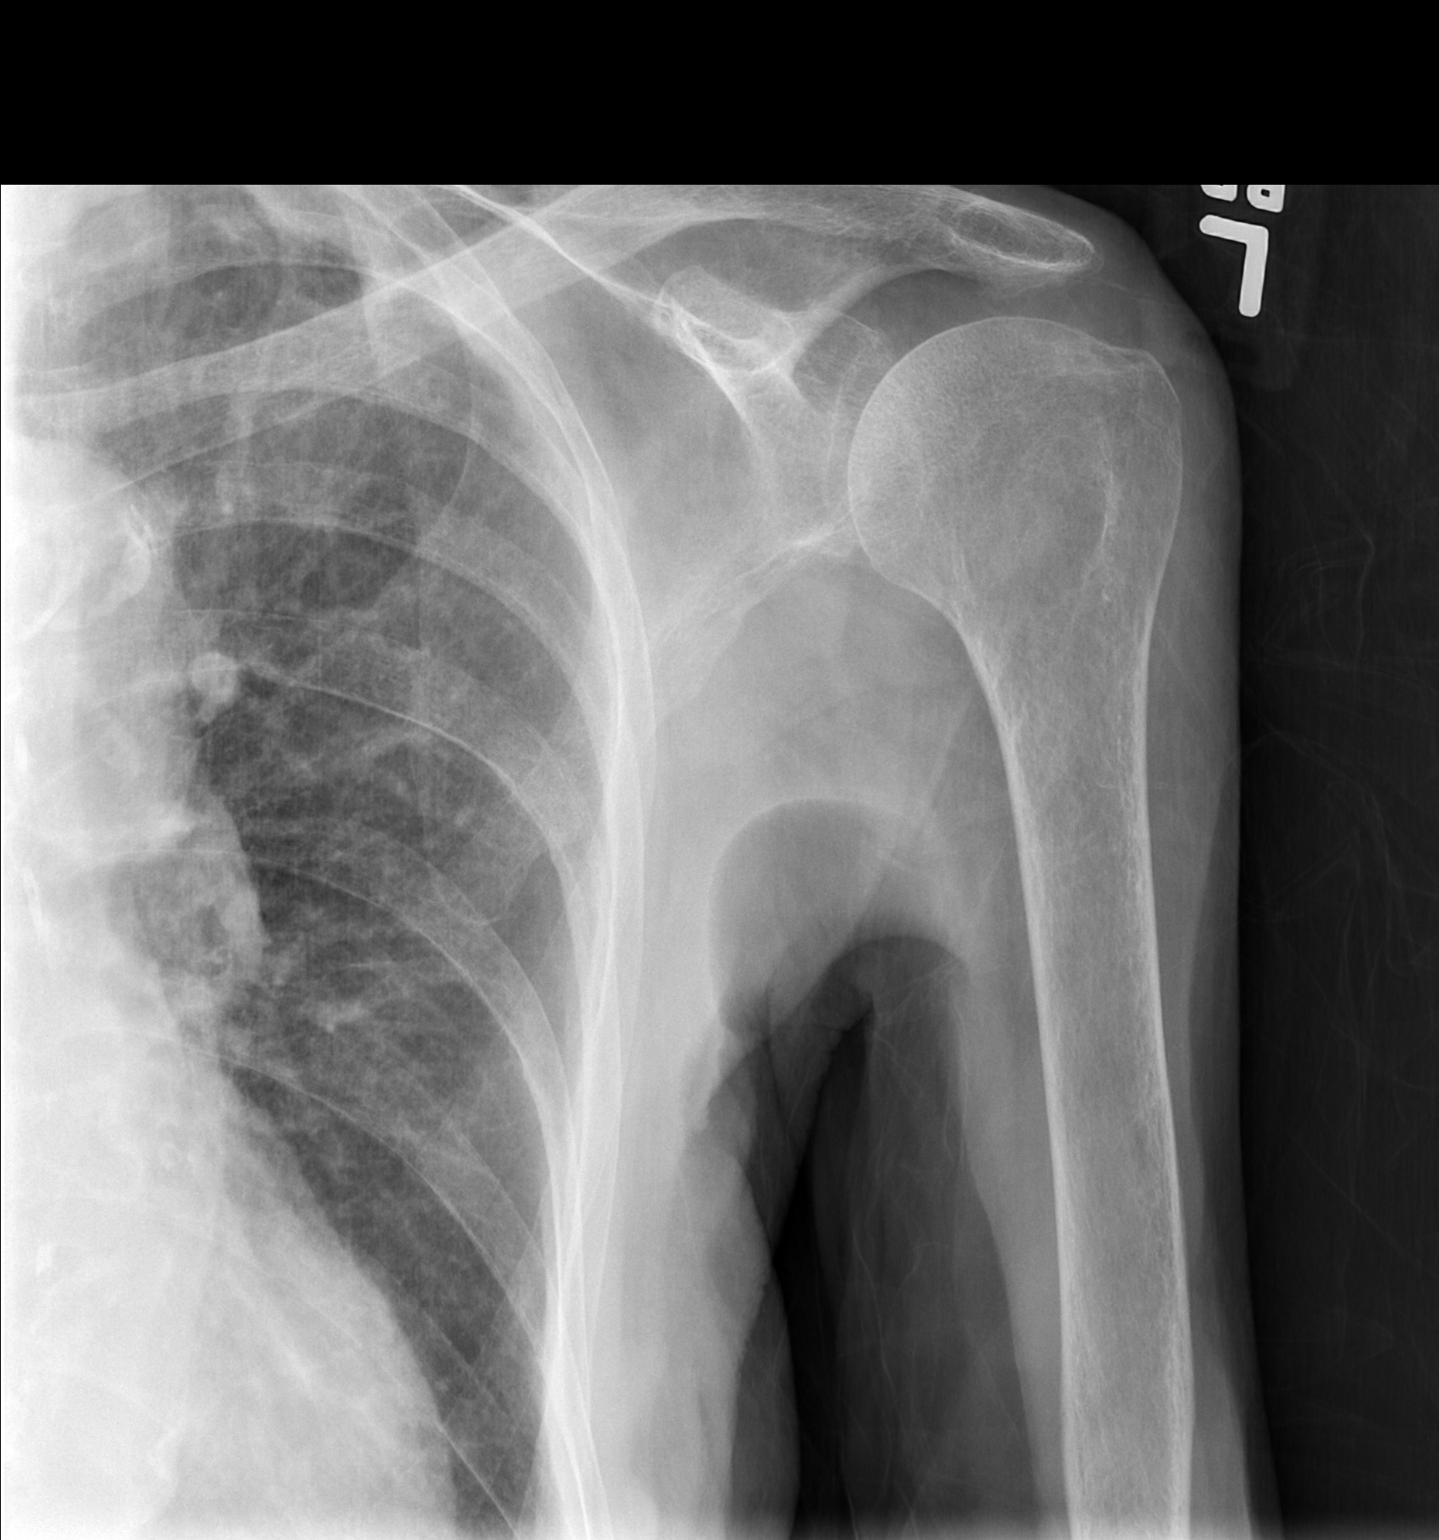

[t shoulder y view left]
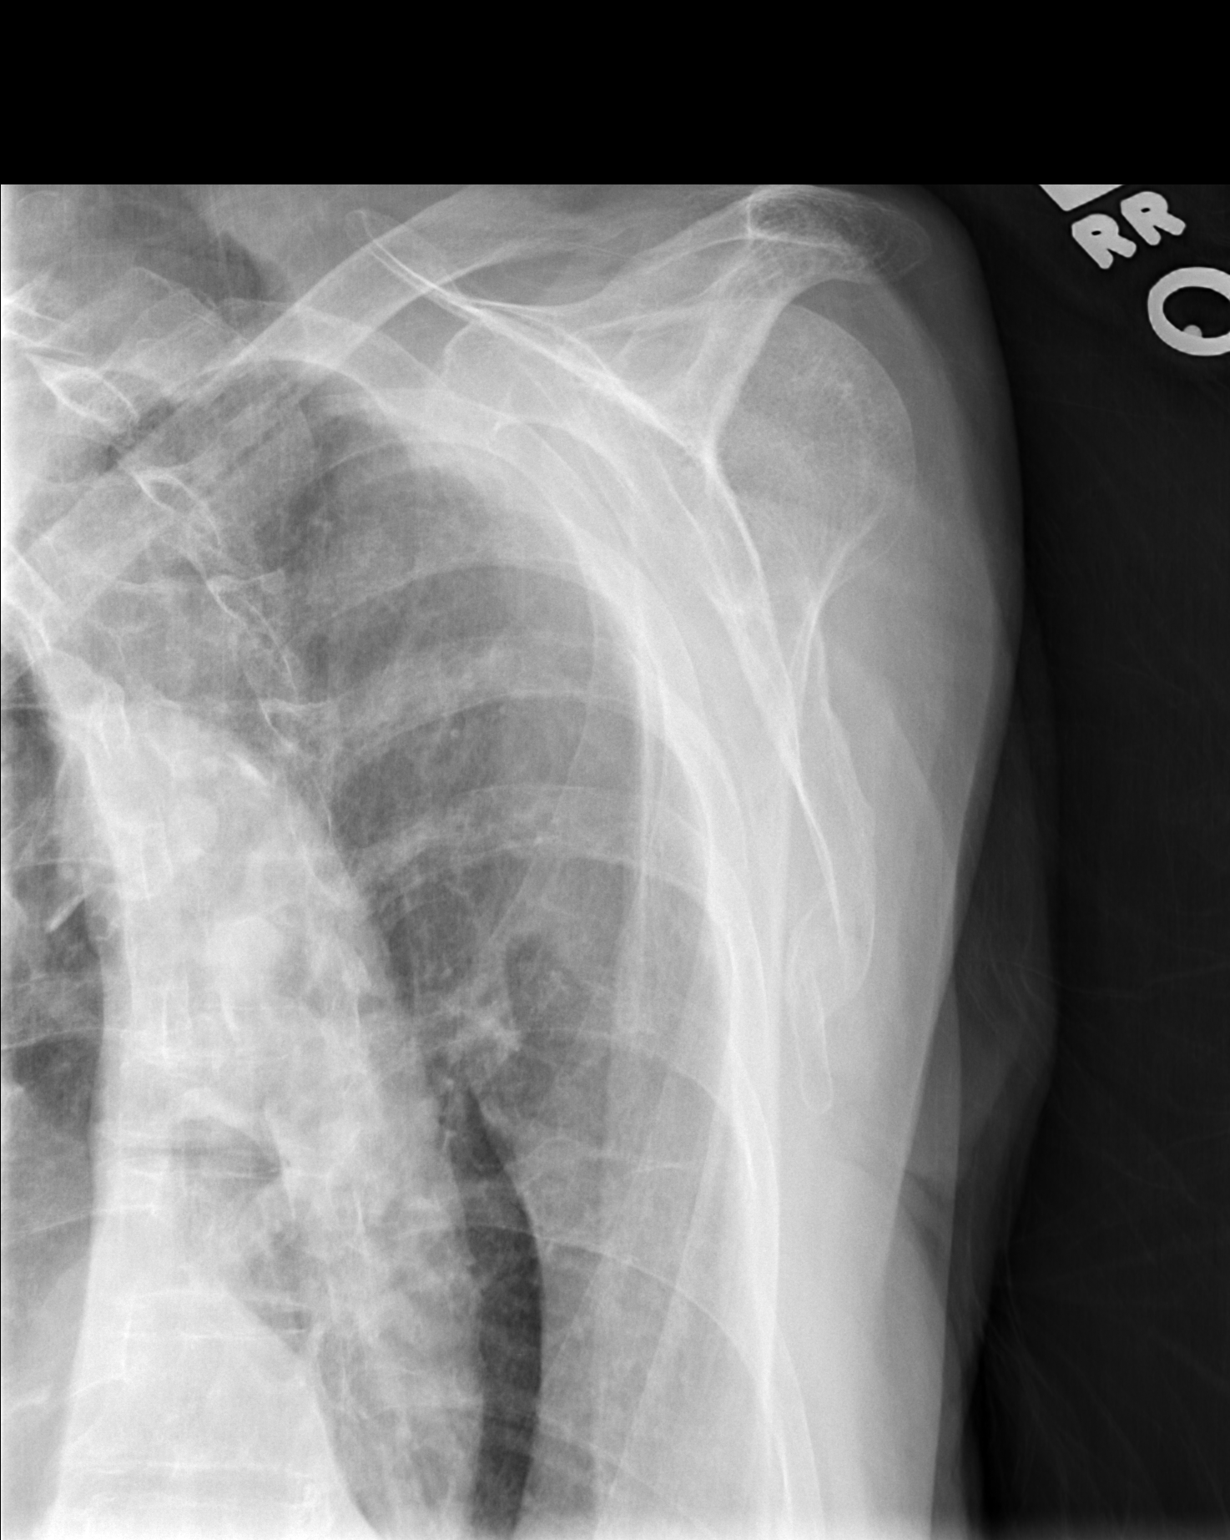

[2 of 2 positions shown; findings below may reference images not displayed]

FINDINGS: There is no evidence of acute fracture or dislocation. Old healed
displaced fracture of the inferior scapula. There is no evidence of
arthropathy or other focal bone abnormality. Osteopenia. Soft
tissues are unremarkable.
IMPRESSION: 1. No acute osseous abnormality.
2. Old healed displaced fracture of the inferior scapula.

## 2023-02-19 ENCOUNTER — Encounter (HOSPITAL_BASED_OUTPATIENT_CLINIC_OR_DEPARTMENT_OTHER): Payer: Self-pay

## 2023-02-19 ENCOUNTER — Emergency Department (HOSPITAL_BASED_OUTPATIENT_CLINIC_OR_DEPARTMENT_OTHER): Payer: Medicare Other

## 2023-02-19 ENCOUNTER — Emergency Department (HOSPITAL_BASED_OUTPATIENT_CLINIC_OR_DEPARTMENT_OTHER)
Admission: EM | Admit: 2023-02-19 | Discharge: 2023-02-20 | Disposition: A | Discharge status: Home / Self Care | Payer: Medicare Other | Attending: Emergency Medicine | Admitting: Emergency Medicine

## 2023-02-19 ENCOUNTER — Other Ambulatory Visit: Payer: Self-pay

## 2023-02-19 DIAGNOSIS — I10 Essential (primary) hypertension: Secondary | ICD-10-CM | POA: Insufficient documentation

## 2023-02-19 DIAGNOSIS — I160 Hypertensive urgency: Secondary | ICD-10-CM

## 2023-02-19 DIAGNOSIS — Z79899 Other long term (current) drug therapy: Secondary | ICD-10-CM | POA: Diagnosis not present

## 2023-02-19 DIAGNOSIS — R0602 Shortness of breath: Secondary | ICD-10-CM | POA: Diagnosis present

## 2023-02-19 DIAGNOSIS — Z7982 Long term (current) use of aspirin: Secondary | ICD-10-CM | POA: Insufficient documentation

## 2023-02-19 DIAGNOSIS — R0600 Dyspnea, unspecified: Secondary | ICD-10-CM

## 2023-02-19 MED ORDER — ALBUTEROL SULFATE HFA 108 (90 BASE) MCG/ACT IN AERS
2.0000 | INHALATION_SPRAY | RESPIRATORY_TRACT | Status: DC | PRN
Start: 2023-02-19 — End: 2023-02-20

## 2023-02-19 NOTE — ED Triage Notes (Signed)
Pt bib family for trouble breathing for several days. Tonight is worse; possible dementia per family. EMS assessed pt at home. Family member concerned she might have accidentally swallowed wrong into airway; possible aspiration--hx of dysphagia. Pt was recently d/c from hospice care in July. Pt given 0.25mg  of oral morphine 1-2 hours ago--rx from hospice for "SOB."

## 2023-02-20 LAB — CBC WITH DIFFERENTIAL/PLATELET
Abs Immature Granulocytes: 0.04 10*3/uL (ref 0.00–0.07)
Basophils Absolute: 0 10*3/uL (ref 0.0–0.1)
Basophils Relative: 0 %
Eosinophils Absolute: 0.1 10*3/uL (ref 0.0–0.5)
Eosinophils Relative: 1 %
HCT: 38 % (ref 36.0–46.0)
Hemoglobin: 12.6 g/dL (ref 12.0–15.0)
Immature Granulocytes: 0 %
Lymphocytes Relative: 9 %
Lymphs Abs: 1 10*3/uL (ref 0.7–4.0)
MCH: 30.1 pg (ref 26.0–34.0)
MCHC: 33.2 g/dL (ref 30.0–36.0)
MCV: 90.7 fL (ref 80.0–100.0)
Monocytes Absolute: 0.6 10*3/uL (ref 0.1–1.0)
Monocytes Relative: 5 %
Neutro Abs: 9.3 10*3/uL — ABNORMAL HIGH (ref 1.7–7.7)
Neutrophils Relative %: 85 %
Platelets: 213 10*3/uL (ref 150–400)
RBC: 4.19 MIL/uL (ref 3.87–5.11)
RDW: 13.1 % (ref 11.5–15.5)
WBC: 10.9 10*3/uL — ABNORMAL HIGH (ref 4.0–10.5)
nRBC: 0 % (ref 0.0–0.2)

## 2023-02-20 LAB — BASIC METABOLIC PANEL
Anion gap: 9 (ref 5–15)
BUN: 31 mg/dL — ABNORMAL HIGH (ref 8–23)
CO2: 28 mmol/L (ref 22–32)
Calcium: 8.8 mg/dL — ABNORMAL LOW (ref 8.9–10.3)
Chloride: 104 mmol/L (ref 98–111)
Creatinine, Ser: 0.76 mg/dL (ref 0.44–1.00)
GFR, Estimated: >60 mL/min (ref >60)
Glucose, Bld: 122 mg/dL — ABNORMAL HIGH (ref 70–99)
Potassium: 3.4 mmol/L — ABNORMAL LOW (ref 3.5–5.1)
Sodium: 141 mmol/L (ref 135–145)

## 2023-02-20 MED ORDER — CLONIDINE HCL 0.1 MG PO TABS
0.2000 mg | ORAL_TABLET | Freq: Once | ORAL | Status: AC
  Administered 2023-02-20: 0.2 mg via ORAL
  Filled 2023-02-20: qty 2

## 2023-02-20 MED ORDER — SODIUM CHLORIDE 0.9 % IV BOLUS
500.0000 mL | Freq: Once | INTRAVENOUS | Status: AC
  Administered 2023-02-20: 500 mL via INTRAVENOUS

## 2023-02-20 NOTE — ED Provider Notes (Addendum)
[place] [facility] Provider Note   CSN: [id number] Arrival date & time: 02/19/23  2327     History  Chief Complaint  Patient presents with   Shortness of Breath    Sierra Giles is a 89 y.o. female.  Patient is a 89 year old female accompanied by her son for evaluation of shortness of breath.  Patient has history of anemia, hypertension, anxiety, and up until July was in hospice.  She was discharged from hospice as she had not been declining.  This evening, patient began to feel short of breath.  Son called EMS who expressed concern over possible aspiration.  She was then brought here by her son.  Patient arrives here hypertensive, but otherwise seems without complaint.  The son did give a dose of morphine and Ativan prior to coming here and she seems to be feeling better.  The history is provided by the patient and a relative.       Home Medications Prior to Admission medications   Medication Sig Start Date End Date Taking? Authorizing Provider  acetaminophen (TYLENOL) 325 MG tablet Take 2 tablets (650 mg total) by mouth every 6 (six) hours as needed (back pain.). 06/10/17   Arrien, York Ram, MD  aspirin 81 MG tablet Take 81 mg by mouth every other day.    [provider]  bisacodyl (DULCOLAX) 5 MG EC tablet Take 1 tablet (5 mg total) by mouth daily as needed for moderate constipation. 06/10/17   Arrien, York Ram, MD  diclofenac Sodium (VOLTAREN) 1 % GEL Apply 2 g topically 4 (four) times daily as needed. 03/20/21   Long, Arlyss Repress, MD  hydrALAZINE (APRESOLINE) 100 MG tablet Take 100 mg by mouth 3 (three) times daily. 03/31/17   [provider]  latanoprost (XALATAN) 0.005 % ophthalmic solution Place 1 drop into both eyes daily. 05/20/17   [provider]  loratadine (CLARITIN) 10 MG tablet Take 10 mg by mouth daily.    [provider]  LORazepam (ATIVAN) 0.5 MG tablet Take 1 tablet (0.5 mg  total) by mouth 2 (two) times daily as needed for anxiety or sleep. 06/10/17   Arrien, York Ram, MD  polyethylene glycol [facility]) packet Take 17 g by mouth daily.    [provider]  sodium chloride (OCEAN) 0.65 % SOLN nasal spray Place 1 spray into both nostrils as needed for congestion. 06/10/17 07/10/17  Arrien, York Ram, MD      Allergies    Sulfamethoxazole-trimethoprim, Amlodipine, Hydrochlorothiazide, Nitrofuran derivatives, and Zolpidem    Review of Systems   Review of Systems  All other systems reviewed and are negative.   Physical Exam Updated Vital Signs BP (!) 206/96 (BP Location: Left Arm)   Pulse 88   Temp 98 F (36.7 C)   Resp 20   Ht 5\' 4"  (1.626 m)   Wt 43.9 kg   SpO2 94%   BMI 16.62 kg/m  Physical Exam Vitals and nursing note reviewed.  Constitutional:      General: She is not in acute distress.    Appearance: She is well-developed. She is not diaphoretic.  HENT:     Head: Normocephalic and atraumatic.  Cardiovascular:     Rate and Rhythm: Normal rate and regular rhythm.     Heart sounds: No murmur heard.    No friction rub. No gallop.  Pulmonary:     Effort: Pulmonary effort is normal. No respiratory distress.     Breath  sounds: Normal breath sounds. No wheezing.  Abdominal:     General: Bowel sounds are normal. There is no distension.     Palpations: Abdomen is soft.     Tenderness: There is no abdominal tenderness.  Musculoskeletal:        General: Normal range of motion.     Cervical back: Normal range of motion and neck supple.     Right lower leg: No edema.     Left lower leg: No edema.  Skin:    General: Skin is warm and dry.  Neurological:     General: No focal deficit present.     Mental Status: She is alert and oriented to person, place, and time.     ED Results / Procedures / Treatments   Labs (all labs ordered are listed, but only abnormal results are displayed) Labs Reviewed  BASIC METABOLIC  PANEL  CBC WITH DIFFERENTIAL/PLATELET    EKG   ED ECG REPORT   Date: 02/20/2023  Rate: 86  Rhythm: normal sinus rhythm  QRS Axis: normal  Intervals: prolonged pr interval  ST/T Wave abnormalities: normal  Conduction Disutrbances:first-degree A-V block   Narrative Interpretation:   Old EKG Reviewed: none available  I have personally reviewed the EKG tracing and agree with the computerized printout as noted.   Radiology No results found.  Procedures Procedures    Medications Ordered in ED Medications  albuterol (VENTOLIN HFA) 108 (90 Base) MCG/ACT inhaler 2 puff (has no administration in time range)  sodium chloride 0.9 % bolus 500 mL (has no administration in time range)  cloNIDine (CATAPRES) tablet 0.2 mg (has no administration in time range)    ED Course/ Medical Decision Making/ A&P  Patient is a 89 year old female brought by her son who is her caretaker for evaluation of shortness of breath.  Chest pain, acutely this evening.  Patient had little additional history secondary to being hard of hearing.  Patient arrives with stable vital signs and is afebrile.  Blood pressure is elevated at 206/96 upon arrival, but patient otherwise appears well.  Oxygen saturations are in the 94% range on room air.  Laboratory studies obtained including CBC and basic metabolic panel, both of which are unremarkable.  Chest x-ray is clear showing only mild cardiomegaly.  Patient has been hydrated with normal saline and seems to be doing well.  She was also given clonidine which she takes at home and blood pressure has now improved to 180/86 at the time of this dictation.  She is not complaining of any shortness of breath and oxygen saturations have maintained in the low to mid 90s which the son tells me is her baseline.  At this point, I feel as though patient can safely be discharged with outpatient follow-up as needed.  Final Clinical Impression(s) / ED Diagnoses Final diagnoses:  None     Rx / DC Orders ED Discharge Orders     None         Geoffery Lyons, MD 02/20/23 [id number]    Geoffery Lyons, MD 02/20/23 Marcello Fennel    Geoffery Lyons, MD 02/20/23 [id number]

## 2023-02-20 NOTE — Discharge Instructions (Signed)
Return to the emergency department if you experience any new and/or concerning issues.

## 2023-04-24 DEATH — deceased
